# Patient Record
Sex: Female | Born: 1950 | Race: Black or African American | Hispanic: No | Smoking: Never smoker
Health system: Southern US, Community
[De-identification: ages and names within clinical notes are randomized; demographics above are authoritative.]

## PROBLEM LIST (undated history)

## (undated) DIAGNOSIS — E119 Type 2 diabetes mellitus without complications: Secondary | ICD-10-CM

## (undated) DIAGNOSIS — I10 Essential (primary) hypertension: Secondary | ICD-10-CM

## (undated) HISTORY — DX: Essential (primary) hypertension: I10

## (undated) HISTORY — PX: COLONOSCOPY: SHX174

## (undated) HISTORY — DX: Type 2 diabetes mellitus without complications: E11.9

---

## 1999-02-24 ENCOUNTER — Other Ambulatory Visit: Admission: RE | Admit: 1999-02-24 | Discharge: 1999-02-24 | Payer: Self-pay | Admitting: Gynecology

## 2000-01-12 ENCOUNTER — Encounter: Payer: Self-pay | Admitting: Gynecology

## 2000-01-12 ENCOUNTER — Encounter: Admission: RE | Admit: 2000-01-12 | Discharge: 2000-01-12 | Payer: Self-pay | Admitting: Gynecology

## 2000-03-15 ENCOUNTER — Other Ambulatory Visit: Admission: RE | Admit: 2000-03-15 | Discharge: 2000-03-15 | Payer: Self-pay | Admitting: Gynecology

## 2001-01-12 ENCOUNTER — Encounter: Admission: RE | Admit: 2001-01-12 | Discharge: 2001-01-12 | Payer: Self-pay | Admitting: Gynecology

## 2001-01-12 ENCOUNTER — Encounter: Payer: Self-pay | Admitting: Gynecology

## 2001-03-20 ENCOUNTER — Other Ambulatory Visit: Admission: RE | Admit: 2001-03-20 | Discharge: 2001-03-20 | Payer: Self-pay | Admitting: Gynecology

## 2002-01-16 ENCOUNTER — Encounter: Payer: Self-pay | Admitting: Gynecology

## 2002-01-16 ENCOUNTER — Encounter: Admission: RE | Admit: 2002-01-16 | Discharge: 2002-01-16 | Payer: Self-pay | Admitting: Gynecology

## 2002-04-16 ENCOUNTER — Other Ambulatory Visit: Admission: RE | Admit: 2002-04-16 | Discharge: 2002-04-16 | Payer: Self-pay | Admitting: Gynecology

## 2002-05-28 ENCOUNTER — Encounter: Admission: RE | Admit: 2002-05-28 | Discharge: 2002-08-26 | Payer: Self-pay | Admitting: Family Medicine

## 2003-01-17 ENCOUNTER — Encounter: Payer: Self-pay | Admitting: Gynecology

## 2003-01-17 ENCOUNTER — Encounter: Admission: RE | Admit: 2003-01-17 | Discharge: 2003-01-17 | Payer: Self-pay | Admitting: Gynecology

## 2003-04-18 ENCOUNTER — Other Ambulatory Visit: Admission: RE | Admit: 2003-04-18 | Discharge: 2003-04-18 | Payer: Self-pay | Admitting: Gynecology

## 2004-01-24 ENCOUNTER — Encounter: Admission: RE | Admit: 2004-01-24 | Discharge: 2004-01-24 | Payer: Self-pay | Admitting: Gynecology

## 2004-04-23 ENCOUNTER — Other Ambulatory Visit: Admission: RE | Admit: 2004-04-23 | Discharge: 2004-04-23 | Payer: Self-pay | Admitting: Gynecology

## 2005-01-26 ENCOUNTER — Encounter: Admission: RE | Admit: 2005-01-26 | Discharge: 2005-01-26 | Payer: Self-pay | Admitting: Gynecology

## 2005-04-26 ENCOUNTER — Other Ambulatory Visit: Admission: RE | Admit: 2005-04-26 | Discharge: 2005-04-26 | Payer: Self-pay | Admitting: Gynecology

## 2006-01-27 ENCOUNTER — Encounter: Admission: RE | Admit: 2006-01-27 | Discharge: 2006-01-27 | Payer: Self-pay | Admitting: Gynecology

## 2007-01-30 ENCOUNTER — Encounter: Admission: RE | Admit: 2007-01-30 | Discharge: 2007-01-30 | Payer: Self-pay | Admitting: Gynecology

## 2008-02-09 ENCOUNTER — Encounter: Admission: RE | Admit: 2008-02-09 | Discharge: 2008-02-09 | Payer: Self-pay | Admitting: Gynecology

## 2009-02-10 ENCOUNTER — Encounter: Admission: RE | Admit: 2009-02-10 | Discharge: 2009-02-10 | Payer: Self-pay | Admitting: Gynecology

## 2010-02-11 ENCOUNTER — Encounter: Admission: RE | Admit: 2010-02-11 | Discharge: 2010-02-11 | Payer: Self-pay | Admitting: Gynecology

## 2011-01-05 ENCOUNTER — Other Ambulatory Visit: Payer: Self-pay | Admitting: Gynecology

## 2011-01-05 DIAGNOSIS — Z1239 Encounter for other screening for malignant neoplasm of breast: Secondary | ICD-10-CM

## 2011-02-16 ENCOUNTER — Ambulatory Visit
Admission: RE | Admit: 2011-02-16 | Discharge: 2011-02-16 | Disposition: A | Discharge status: Home / Self Care | Payer: BC Managed Care – PPO | Source: Ambulatory Visit | Attending: Gynecology | Admitting: Gynecology

## 2011-02-16 DIAGNOSIS — Z1239 Encounter for other screening for malignant neoplasm of breast: Secondary | ICD-10-CM

## 2011-02-18 ENCOUNTER — Other Ambulatory Visit: Payer: Self-pay | Admitting: Gynecology

## 2011-02-18 DIAGNOSIS — R928 Other abnormal and inconclusive findings on diagnostic imaging of breast: Secondary | ICD-10-CM

## 2011-02-25 ENCOUNTER — Ambulatory Visit
Admission: RE | Admit: 2011-02-25 | Discharge: 2011-02-25 | Disposition: A | Discharge status: Home / Self Care | Payer: BC Managed Care – PPO | Source: Ambulatory Visit | Attending: Gynecology | Admitting: Gynecology

## 2011-02-25 DIAGNOSIS — R928 Other abnormal and inconclusive findings on diagnostic imaging of breast: Secondary | ICD-10-CM

## 2011-07-20 ENCOUNTER — Other Ambulatory Visit: Payer: Self-pay | Admitting: Gynecology

## 2011-07-20 DIAGNOSIS — R921 Mammographic calcification found on diagnostic imaging of breast: Secondary | ICD-10-CM

## 2011-08-26 ENCOUNTER — Ambulatory Visit
Admission: RE | Admit: 2011-08-26 | Discharge: 2011-08-26 | Disposition: A | Discharge status: Home / Self Care | Payer: BC Managed Care – PPO | Source: Ambulatory Visit | Attending: Gynecology | Admitting: Gynecology

## 2011-08-26 DIAGNOSIS — R921 Mammographic calcification found on diagnostic imaging of breast: Secondary | ICD-10-CM

## 2012-01-18 ENCOUNTER — Other Ambulatory Visit: Payer: Self-pay | Admitting: Gynecology

## 2012-01-18 DIAGNOSIS — R921 Mammographic calcification found on diagnostic imaging of breast: Secondary | ICD-10-CM

## 2012-02-18 ENCOUNTER — Inpatient Hospital Stay: Admission: RE | Admit: 2012-02-18 | Payer: BC Managed Care – PPO | Source: Ambulatory Visit

## 2012-03-01 ENCOUNTER — Ambulatory Visit
Admission: RE | Admit: 2012-03-01 | Discharge: 2012-03-01 | Disposition: A | Discharge status: Home / Self Care | Payer: BC Managed Care – PPO | Source: Ambulatory Visit | Attending: Gynecology | Admitting: Gynecology

## 2012-03-01 DIAGNOSIS — R921 Mammographic calcification found on diagnostic imaging of breast: Secondary | ICD-10-CM

## 2012-06-07 ENCOUNTER — Encounter: Payer: BC Managed Care – PPO | Attending: Unknown Physician Specialty | Admitting: Dietician

## 2012-06-07 DIAGNOSIS — Z713 Dietary counseling and surveillance: Secondary | ICD-10-CM | POA: Insufficient documentation

## 2012-06-07 DIAGNOSIS — E119 Type 2 diabetes mellitus without complications: Secondary | ICD-10-CM | POA: Insufficient documentation

## 2012-06-08 ENCOUNTER — Encounter: Payer: Self-pay | Admitting: Dietician

## 2012-06-08 NOTE — Progress Notes (Signed)
  Patient was seen on 06/07/2012 for the first of a series of three diabetes self-management courses at the [facility]. The following learning objectives were met by the patient during this course:   Defines the role of glucose and insulin  Identifies type of diabetes and pathophysiology  Defines the diagnostic criteria for diabetes and prediabetes  States the risk factors for Type 2 Diabetes  States the symptoms of Type 2 Diabetes  Defines Type 2 Diabetes treatment goals  Defines Type 2 Diabetes treatment options  States the rationale for glucose monitoring  Identifies A1C, glucose targets, and testing times  Identifies proper sharps disposal  Defines the purpose of a diabetes food plan  Identifies carbohydrate food groups  Defines effects of carbohydrate foods on glucose levels  Identifies carbohydrate choices/grams/food labels  States benefits of physical activity and effect on glucose  Review of suggested activity guidelines  HgA1C: 8.3%  (05/25/2012)  Handouts given during class include:  Type 2 Diabetes: Basics Book  My Food Plan Book  Food and Activity Log  Patient has established the following initial goals:  Increase exercise  Follow a diabetes meal plan  Lose weight  Follow-Up Plan: Attend the Diabetes Self-Management Core Classes

## 2012-07-06 ENCOUNTER — Ambulatory Visit: Payer: BC Managed Care – PPO

## 2012-07-20 ENCOUNTER — Ambulatory Visit: Payer: BC Managed Care – PPO

## 2012-08-03 ENCOUNTER — Encounter: Payer: BC Managed Care – PPO | Attending: Unknown Physician Specialty | Admitting: Dietician

## 2012-08-03 DIAGNOSIS — E119 Type 2 diabetes mellitus without complications: Secondary | ICD-10-CM | POA: Insufficient documentation

## 2012-08-03 DIAGNOSIS — Z713 Dietary counseling and surveillance: Secondary | ICD-10-CM | POA: Insufficient documentation

## 2012-08-04 NOTE — Progress Notes (Signed)
  Patient was seen on 08/03/2012 for the second of a series of three diabetes self-management courses at the [facility]. The following learning objectives were met by the patient during this course:   Explain basic nutrition maintenance and quality assurance  Describe causes, symptoms and treatment of hypoglycemia and hyperglycemia  Explain how to manage diabetes during illness  Describe the importance of good nutrition for health and healthy eating strategies  List strategies to follow meal plan when dining out  Describe the effects of alcohol on glucose and how to use it safely  Describe problem solving skills for day-to-day glucose challenges  Describe strategies to use when treatment plan needs to change  Identify important factors involved in successful weight loss  Describe ways to remain physically active  Describe the impact of regular activity on insulin resistance   Handouts given in class:  Refrigerator magnet for Sick Day Guidelines  [facility] Oral medication/insulin handout  Follow-Up Plan: Patient will attend the final class of the ADA Diabetes Self-Care Education.   

## 2012-08-17 ENCOUNTER — Encounter: Payer: Self-pay | Admitting: *Deleted

## 2012-08-17 ENCOUNTER — Encounter: Payer: BC Managed Care – PPO | Attending: Unknown Physician Specialty | Admitting: *Deleted

## 2012-08-17 DIAGNOSIS — Z713 Dietary counseling and surveillance: Secondary | ICD-10-CM | POA: Insufficient documentation

## 2012-08-17 DIAGNOSIS — E119 Type 2 diabetes mellitus without complications: Secondary | ICD-10-CM | POA: Insufficient documentation

## 2012-08-17 NOTE — Patient Instructions (Signed)
Goals:  Follow Diabetes Meal Plan as instructed  Eat 3 meals and 2 snacks, every 3-5 hrs  Limit carbohydrate intake to 30-45 grams carbohydrate/meal  Limit carbohydrate intake to 15 grams carbohydrate/snack  Add lean protein foods to meals/snacks  Monitor glucose levels as instructed by your doctor  Aim for 30 mins of physical activity daily  Bring food record and glucose log to your next nutrition visit 

## 2012-08-17 NOTE — Progress Notes (Signed)
  Patient was seen on 08/17/12 for the third of a series of three diabetes self-management courses at the [facility]. The following learning objectives were met by the patient during this course:    Describe how diabetes changes over time   Identify diabetes complications and ways to prevent them   Describe strategies that can promote heart health including lowering blood pressure and cholesterol   Describe strategies to lower dietary fat and sodium in the diet   Identify physical activities that benefit cardiovascular health   Evaluate success in meeting personal goal   Describe the belief that they can live successfully with diabetes day to day   Establish 2-3 goals that they will plan to diligently work on until they return for the free 6-month follow-up visit  The following handouts were given in class:  3 Month Follow Up Visit handout  Goal setting handout  Class evaluation form  Your patient has established the following 3 month goals for diabetes self-care:  Increase activity at least 3 days a week  Be active 15 minutes  Follow-Up Plan: Patient will attend a 3 month follow-up visit for diabetes self-management education.

## 2012-11-16 ENCOUNTER — Encounter: Payer: Self-pay | Admitting: *Deleted

## 2012-11-16 ENCOUNTER — Ambulatory Visit: Payer: BC Managed Care – PPO | Admitting: *Deleted

## 2012-11-16 ENCOUNTER — Encounter: Payer: BC Managed Care – PPO | Attending: Unknown Physician Specialty | Admitting: *Deleted

## 2012-11-16 DIAGNOSIS — Z713 Dietary counseling and surveillance: Secondary | ICD-10-CM | POA: Insufficient documentation

## 2012-11-16 DIAGNOSIS — E119 Type 2 diabetes mellitus without complications: Secondary | ICD-10-CM | POA: Insufficient documentation

## 2012-11-16 NOTE — Patient Instructions (Signed)
Goals:  Follow Diabetes Meal Plan as instructed  Eat 3 meals and 2 snacks, every 3-5 hrs  Limit carbohydrate intake to 45 grams carbohydrate/meal  Limit carbohydrate intake to 15 grams carbohydrate/snack  Add lean protein foods to meals/snacks  Monitor glucose levels as instructed by your doctor  Aim for 30 mins of physical activity daily  Bring food record and glucose log to your next nutrition visit  

## 2012-11-16 NOTE — Progress Notes (Signed)
HbA1c  Is close to 6%!  Patient was seen on 11/16/12 for their 3 month follow-up as a part of the diabetes self-management courses at the [facility]. The following learning objectives were met by your patient during this course:  Patient self reports the following:  Diabetes control has improved since diabetes self-management training: feels more in control Number of days blood glucose is >200: none Last MD appointment for diabetes: August Changes in treatment plan: none Confidence with ability to manage diabetes: feels good Areas for improvement with diabetes self-care: eating more healthy Willingness to participate in diabetes support group: not able to make it do to scheduling  Please see Diabetes Flow sheet for findings related to patient's self-care.  Follow-Up Plan: Patient is eligible for a "free" 30 minute diabetes self-care appointment in the next year. Patient to call and schedule as needed.

## 2012-12-13 HISTORY — PX: BREAST BIOPSY: SHX20

## 2013-01-23 ENCOUNTER — Other Ambulatory Visit: Payer: Self-pay | Admitting: Gynecology

## 2013-01-23 DIAGNOSIS — R921 Mammographic calcification found on diagnostic imaging of breast: Secondary | ICD-10-CM

## 2013-03-02 ENCOUNTER — Ambulatory Visit
Admission: RE | Admit: 2013-03-02 | Discharge: 2013-03-02 | Disposition: A | Discharge status: Home / Self Care | Payer: BC Managed Care – PPO | Source: Ambulatory Visit | Attending: Gynecology | Admitting: Gynecology

## 2013-03-02 ENCOUNTER — Other Ambulatory Visit: Payer: Self-pay | Admitting: Gynecology

## 2013-03-02 DIAGNOSIS — R921 Mammographic calcification found on diagnostic imaging of breast: Secondary | ICD-10-CM

## 2013-03-09 ENCOUNTER — Ambulatory Visit
Admission: RE | Admit: 2013-03-09 | Discharge: 2013-03-09 | Disposition: A | Discharge status: Home / Self Care | Payer: BC Managed Care – PPO | Source: Ambulatory Visit | Attending: Gynecology | Admitting: Gynecology

## 2013-03-09 DIAGNOSIS — R921 Mammographic calcification found on diagnostic imaging of breast: Secondary | ICD-10-CM

## 2013-06-28 ENCOUNTER — Other Ambulatory Visit: Payer: Self-pay | Admitting: Gynecology

## 2014-01-24 ENCOUNTER — Other Ambulatory Visit: Payer: Self-pay

## 2014-01-24 DIAGNOSIS — Z1231 Encounter for screening mammogram for malignant neoplasm of breast: Secondary | ICD-10-CM

## 2014-03-08 ENCOUNTER — Ambulatory Visit
Admission: RE | Admit: 2014-03-08 | Discharge: 2014-03-08 | Disposition: A | Discharge status: Home / Self Care | Payer: BC Managed Care – PPO | Source: Ambulatory Visit

## 2014-03-08 DIAGNOSIS — Z1231 Encounter for screening mammogram for malignant neoplasm of breast: Secondary | ICD-10-CM

## 2014-04-03 ENCOUNTER — Encounter: Payer: Self-pay | Admitting: Internal Medicine

## 2014-04-22 ENCOUNTER — Encounter: Payer: Self-pay | Admitting: Internal Medicine

## 2014-05-29 ENCOUNTER — Ambulatory Visit (AMBULATORY_SURGERY_CENTER): Payer: Self-pay | Admitting: *Deleted

## 2014-05-29 VITALS — Ht 64.5 in | Wt 177.0 lb

## 2014-05-29 DIAGNOSIS — Z1211 Encounter for screening for malignant neoplasm of colon: Secondary | ICD-10-CM

## 2014-05-29 MED ORDER — MOVIPREP 100 G PO SOLR: ORAL | Status: DC

## 2014-05-29 NOTE — Progress Notes (Signed)
Patient denies any allergies to eggs or soy. Patient denies any problems with anesthesia/sedation. Patient denies any oxygen use at home and does not take any diet/weight loss medications. EMMI education assisgned to patient on colonoscopy, this was explained and instructions given to patient. 

## 2014-05-30 ENCOUNTER — Encounter: Payer: Self-pay | Admitting: Internal Medicine

## 2014-06-12 ENCOUNTER — Encounter: Payer: BC Managed Care – PPO | Admitting: Internal Medicine

## 2014-06-21 ENCOUNTER — Encounter: Payer: Self-pay | Admitting: Internal Medicine

## 2014-06-21 ENCOUNTER — Ambulatory Visit (AMBULATORY_SURGERY_CENTER): Payer: BC Managed Care – PPO | Admitting: Internal Medicine

## 2014-06-21 VITALS — BP 127/73 | HR 55 | Temp 97.8°F | Resp 29 | Ht 64.5 in | Wt 177.0 lb

## 2014-06-21 DIAGNOSIS — Z1211 Encounter for screening for malignant neoplasm of colon: Secondary | ICD-10-CM

## 2014-06-21 MED ORDER — SODIUM CHLORIDE 0.9 % IV SOLN: 500.0000 mL | INTRAVENOUS | Status: DC

## 2014-06-21 NOTE — Op Note (Addendum)
[facility]  [address] [place], [id number]   COLONOSCOPY PROCEDURE REPORT  PATIENT: Ellen Rosales, Ellen Rosales  MR#: [id number] BIRTHDATE: 1951/03/18 , 57  yrs. old GENDER: Female ENDOSCOPIST: Lafayette Dragon, MD REFERRED [id number] Leola Brazil, M.D. , Dr Marcelle Overlie PROCEDURE DATE:  06/21/2014 PROCEDURE:   Colonoscopy, screening First Screening Colonoscopy - Avg.  risk and is 50 yrs.  old or older - No.  Prior Negative Screening - Now for repeat screening. 10 or more years since last screening  History of Adenoma - Now for follow-up colonoscopy & has been > or = to 3 yrs.  N/A  Polyps Removed Today? No.  Recommend repeat exam, <10 yrs? No. ASA CLASS:   Class II INDICATIONS:Average risk patient for colon cancer and loss colonoscopy July 2005 was normal. MEDICATIONS: MAC sedation, administered by CRNA and propofol (Diprivan) 200mg  IV  DESCRIPTION OF PROCEDURE:   After the risks benefits and alternatives of the procedure were thoroughly explained, informed consent was obtained.  A digital rectal exam revealed no abnormalities of the rectum.   The LB [id number]  endoscope was introduced through the anus and advanced to the cecum, which was identified by both the appendix and ileocecal valve. No adverse events experienced.   The quality of the prep was excellent, using MoviPrep  The instrument was then slowly withdrawn as the colon was fully examined.      COLON FINDINGS: A normal appearing cecum, ileocecal valve, and appendiceal orifice were identified.  The ascending, hepatic flexure, transverse, splenic flexure, descending, sigmoid colon and rectum appeared unremarkable.  No polyps or cancers were seen. Retroflexed views revealed no abnormalities. The time to cecum=5 minutes 13 seconds.  Withdrawal time=6 minutes 570 seconds.  The scope was withdrawn and the procedure completed. COMPLICATIONS: There were no complications.  ENDOSCOPIC IMPRESSION: Normal  colon  RECOMMENDATIONS: high fiber diet Recall colonoscopy in 10 years   eSigned:  Lafayette Dragon, MD 06/21/2014 11:01 AM Revised: 06/21/2014 11:01 AM  cc:   PATIENT NAME:  Sheba, Whaling MR#: [id number]

## 2014-06-21 NOTE — Patient Instructions (Signed)
YOU HAD AN ENDOSCOPIC PROCEDURE TODAY AT THE [facility]: Refer to the procedure report that was given to you for any specific questions about what was found during the examination.  If the procedure report does not answer your questions, please call your gastroenterologist to clarify.  If you requested that your care partner not be given the details of your procedure findings, then the procedure report has been included in a sealed envelope for you to review at your convenience later.  YOU SHOULD EXPECT: Some feelings of bloating in the abdomen. Passage of more gas than usual.  Walking can help get rid of the air that was put into your GI tract during the procedure and reduce the bloating. If you had a lower endoscopy (such as a colonoscopy or flexible sigmoidoscopy) you may notice spotting of blood in your stool or on the toilet paper. If you underwent a bowel prep for your procedure, then you may not have a normal bowel movement for a few days.  DIET: Your first meal following the procedure should be a light meal and then it is ok to progress to your normal diet.  A half-sandwich or bowl of soup is an example of a good first meal.  Heavy or fried foods are harder to digest and may make you feel nauseous or bloated.  Likewise meals heavy in dairy and vegetables can cause extra gas to form and this can also increase the bloating.  Drink plenty of fluids but you should avoid alcoholic beverages for 24 hours.  ACTIVITY: Your care partner should take you home directly after the procedure.  You should plan to take it easy, moving slowly for the rest of the day.  You can resume normal activity the day after the procedure however you should NOT DRIVE or use heavy machinery for 24 hours (because of the sedation medicines used during the test).    SYMPTOMS TO REPORT IMMEDIATELY: A gastroenterologist can be reached at any hour.  During normal business hours, 8:30 AM to 5:00 PM Monday through Friday,  call [phone].  After hours and on weekends, please call the GI answering service at [phone] who will take a message and have the physician on call contact you.   Following lower endoscopy (colonoscopy or flexible sigmoidoscopy):  Excessive amounts of blood in the stool  Significant tenderness or worsening of abdominal pains  Swelling of the abdomen that is new, acute  Fever of 100F or higher    FOLLOW UP: If any biopsies were taken you will be contacted by phone or by letter within the next 1-3 weeks.  Call your gastroenterologist if you have not heard about the biopsies in 3 weeks.  Our staff will call the home number listed on your records the next business day following your procedure to check on you and address any questions or concerns that you may have at that time regarding the information given to you following your procedure. This is a courtesy call and so if there is no answer at the home number and we have not heard from you through the emergency physician on call, we will assume that you have returned to your regular daily activities without incident.  SIGNATURES/CONFIDENTIALITY: You and/or your care partner have signed paperwork which will be entered into your electronic medical record.  These signatures attest to the fact that that the information above on your After Visit Summary has been reviewed and is understood.  Full responsibility of the confidentiality   of this discharge information lies with you and/or your care-partner.  NORMAL COLONOSCOPY, repeat in 10 years-2025  High fiber diet information given.

## 2014-06-21 NOTE — Progress Notes (Signed)
A/ox3 pleased with MAC, report to Jane RN 

## 2014-06-24 ENCOUNTER — Telehealth: Payer: Self-pay | Admitting: *Deleted

## 2014-06-24 NOTE — Telephone Encounter (Signed)
  Follow up Call-  Call back number 06/21/2014  Post procedure Call Back phone  # [phone]  Permission to leave phone message Yes     Patient questions:  Do you have a fever, pain , or abdominal swelling? No. Pain Score  0 *  Have you tolerated food without any problems? Yes.    Have you been able to return to your normal activities? Yes.    Do you have any questions about your discharge instructions: Diet   No. Medications  No. Follow up visit  No.  Do you have questions or concerns about your Care? No.  Actions: * If pain score is 4 or above: No action needed, pain <4.

## 2014-07-11 ENCOUNTER — Other Ambulatory Visit: Payer: Self-pay | Admitting: Gynecology

## 2014-07-12 LAB — CYTOLOGY - PAP

## 2014-08-23 ENCOUNTER — Encounter: Payer: Self-pay | Admitting: Internal Medicine

## 2014-12-12 ENCOUNTER — Other Ambulatory Visit: Payer: Self-pay

## 2014-12-12 DIAGNOSIS — Z1231 Encounter for screening mammogram for malignant neoplasm of breast: Secondary | ICD-10-CM

## 2015-03-10 ENCOUNTER — Ambulatory Visit
Admission: RE | Admit: 2015-03-10 | Discharge: 2015-03-10 | Disposition: A | Discharge status: Home / Self Care | Payer: BLUE CROSS/BLUE SHIELD | Source: Ambulatory Visit

## 2015-03-10 DIAGNOSIS — Z1231 Encounter for screening mammogram for malignant neoplasm of breast: Secondary | ICD-10-CM

## 2015-07-23 ENCOUNTER — Other Ambulatory Visit: Payer: Self-pay

## 2015-07-28 LAB — CYTOLOGY - PAP

## 2016-01-30 ENCOUNTER — Other Ambulatory Visit: Payer: Self-pay

## 2016-01-30 DIAGNOSIS — Z1231 Encounter for screening mammogram for malignant neoplasm of breast: Secondary | ICD-10-CM

## 2016-03-12 ENCOUNTER — Ambulatory Visit
Admission: RE | Admit: 2016-03-12 | Discharge: 2016-03-12 | Disposition: A | Discharge status: Home / Self Care | Payer: Managed Care, Other (non HMO) | Source: Ambulatory Visit

## 2016-03-12 DIAGNOSIS — Z1231 Encounter for screening mammogram for malignant neoplasm of breast: Secondary | ICD-10-CM

## 2016-08-06 ENCOUNTER — Other Ambulatory Visit: Payer: Self-pay | Admitting: Obstetrics and Gynecology

## 2016-08-09 LAB — CYTOLOGY - PAP

## 2017-01-31 ENCOUNTER — Other Ambulatory Visit: Payer: Self-pay | Admitting: Family Medicine

## 2017-01-31 DIAGNOSIS — Z1231 Encounter for screening mammogram for malignant neoplasm of breast: Secondary | ICD-10-CM

## 2017-03-18 ENCOUNTER — Ambulatory Visit
Admission: RE | Admit: 2017-03-18 | Discharge: 2017-03-18 | Disposition: A | Discharge status: Home / Self Care | Payer: Managed Care, Other (non HMO) | Source: Ambulatory Visit | Attending: Family Medicine | Admitting: Family Medicine

## 2017-03-18 DIAGNOSIS — Z1231 Encounter for screening mammogram for malignant neoplasm of breast: Secondary | ICD-10-CM

## 2018-02-08 ENCOUNTER — Other Ambulatory Visit: Payer: Self-pay | Admitting: Family Medicine

## 2018-02-08 DIAGNOSIS — Z1231 Encounter for screening mammogram for malignant neoplasm of breast: Secondary | ICD-10-CM

## 2018-03-21 ENCOUNTER — Ambulatory Visit
Admission: RE | Admit: 2018-03-21 | Discharge: 2018-03-21 | Disposition: A | Discharge status: Home / Self Care | Payer: Managed Care, Other (non HMO) | Source: Ambulatory Visit | Attending: Family Medicine | Admitting: Family Medicine

## 2018-03-21 DIAGNOSIS — Z1231 Encounter for screening mammogram for malignant neoplasm of breast: Secondary | ICD-10-CM

## 2018-08-23 ENCOUNTER — Other Ambulatory Visit (HOSPITAL_COMMUNITY): Payer: Self-pay | Admitting: Family Medicine

## 2018-08-23 DIAGNOSIS — R011 Cardiac murmur, unspecified: Secondary | ICD-10-CM

## 2018-09-15 ENCOUNTER — Other Ambulatory Visit: Payer: Self-pay

## 2018-09-15 ENCOUNTER — Ambulatory Visit (HOSPITAL_COMMUNITY): Payer: Managed Care, Other (non HMO) | Attending: Cardiology

## 2018-09-15 DIAGNOSIS — R011 Cardiac murmur, unspecified: Secondary | ICD-10-CM | POA: Diagnosis not present

## 2018-09-15 DIAGNOSIS — I728 Aneurysm of other specified arteries: Secondary | ICD-10-CM | POA: Diagnosis not present

## 2018-09-15 DIAGNOSIS — E119 Type 2 diabetes mellitus without complications: Secondary | ICD-10-CM | POA: Diagnosis not present

## 2018-09-15 DIAGNOSIS — I083 Combined rheumatic disorders of mitral, aortic and tricuspid valves: Secondary | ICD-10-CM | POA: Diagnosis not present

## 2019-02-09 ENCOUNTER — Other Ambulatory Visit: Payer: Self-pay | Admitting: Family Medicine

## 2019-02-09 DIAGNOSIS — Z1231 Encounter for screening mammogram for malignant neoplasm of breast: Secondary | ICD-10-CM

## 2019-03-23 ENCOUNTER — Ambulatory Visit: Payer: Managed Care, Other (non HMO)

## 2019-05-08 ENCOUNTER — Ambulatory Visit
Admission: RE | Admit: 2019-05-08 | Discharge: 2019-05-08 | Disposition: A | Discharge status: Home / Self Care | Payer: Managed Care, Other (non HMO) | Source: Ambulatory Visit | Attending: Family Medicine | Admitting: Family Medicine

## 2019-05-08 ENCOUNTER — Other Ambulatory Visit: Payer: Self-pay

## 2019-05-08 DIAGNOSIS — Z1231 Encounter for screening mammogram for malignant neoplasm of breast: Secondary | ICD-10-CM

## 2019-05-09 ENCOUNTER — Other Ambulatory Visit: Payer: Self-pay | Admitting: Family Medicine

## 2019-05-09 DIAGNOSIS — R928 Other abnormal and inconclusive findings on diagnostic imaging of breast: Secondary | ICD-10-CM

## 2019-05-17 ENCOUNTER — Ambulatory Visit
Admission: RE | Admit: 2019-05-17 | Discharge: 2019-05-17 | Disposition: A | Discharge status: Home / Self Care | Payer: Managed Care, Other (non HMO) | Source: Ambulatory Visit | Attending: Family Medicine | Admitting: Family Medicine

## 2019-05-17 ENCOUNTER — Other Ambulatory Visit: Payer: Self-pay

## 2019-05-17 ENCOUNTER — Ambulatory Visit: Payer: Managed Care, Other (non HMO)

## 2019-05-17 DIAGNOSIS — R928 Other abnormal and inconclusive findings on diagnostic imaging of breast: Secondary | ICD-10-CM

## 2019-05-17 IMAGING — MG DIGITAL DIAGNOSTIC UNILATERAL LEFT MAMMOGRAM WITH TOMO AND CAD
8 series · 8 of 24 positions shown · non-contrast
Comparison: Previous exam(s).

CLINICAL DATA: 67-year-old female recalled from screening mammogram
dated [DATE] for a possible left breast asymmetry.

EXAM:
DIGITAL DIAGNOSTIC UNILATERAL LEFT MAMMOGRAM WITH CAD AND TOMO

[L ML synth-2D]
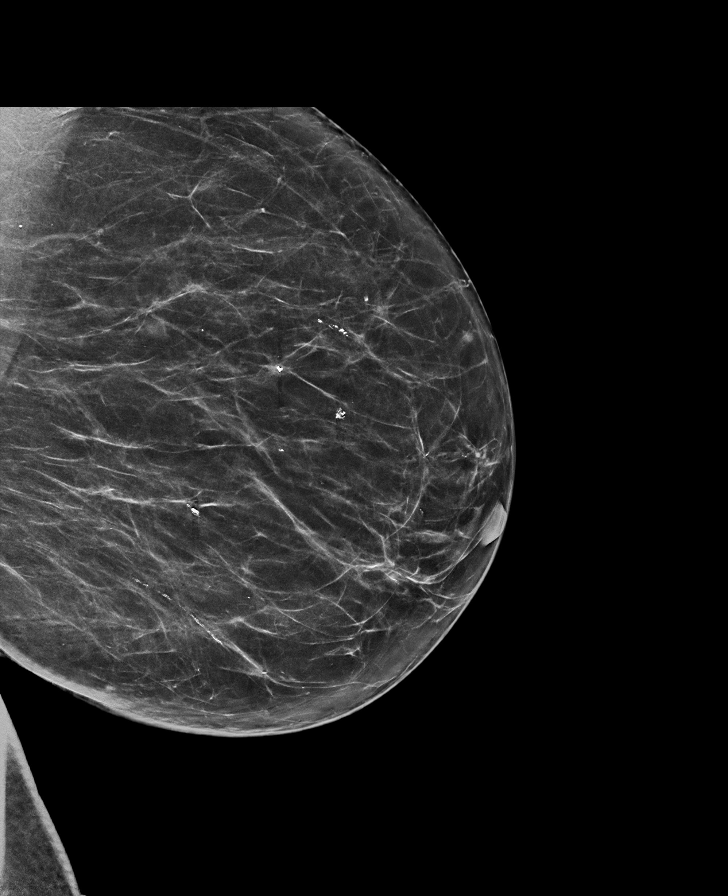

[L MLO synth-2D]
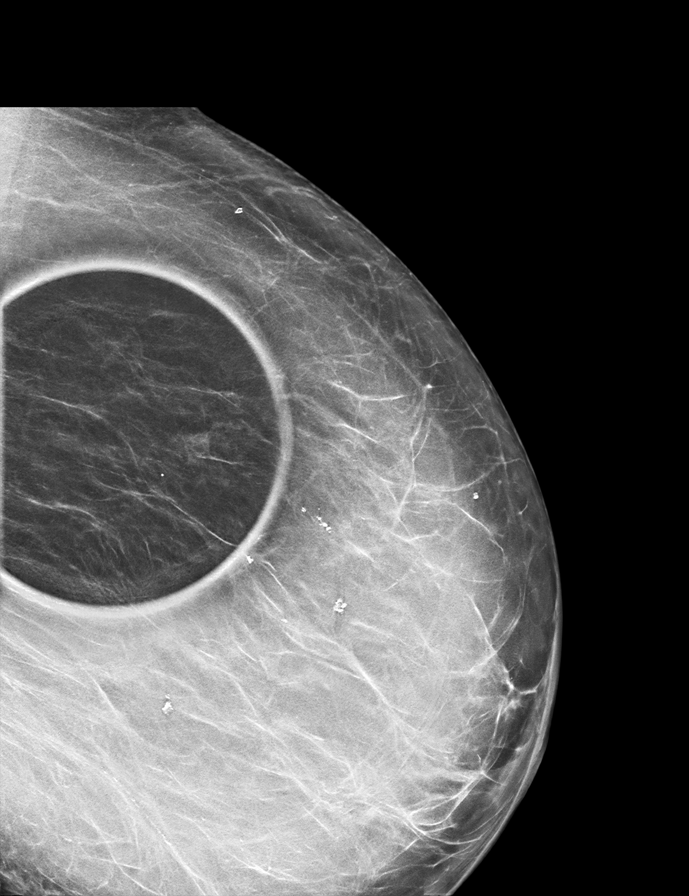

[L CC synth-2D (1 of 2)]
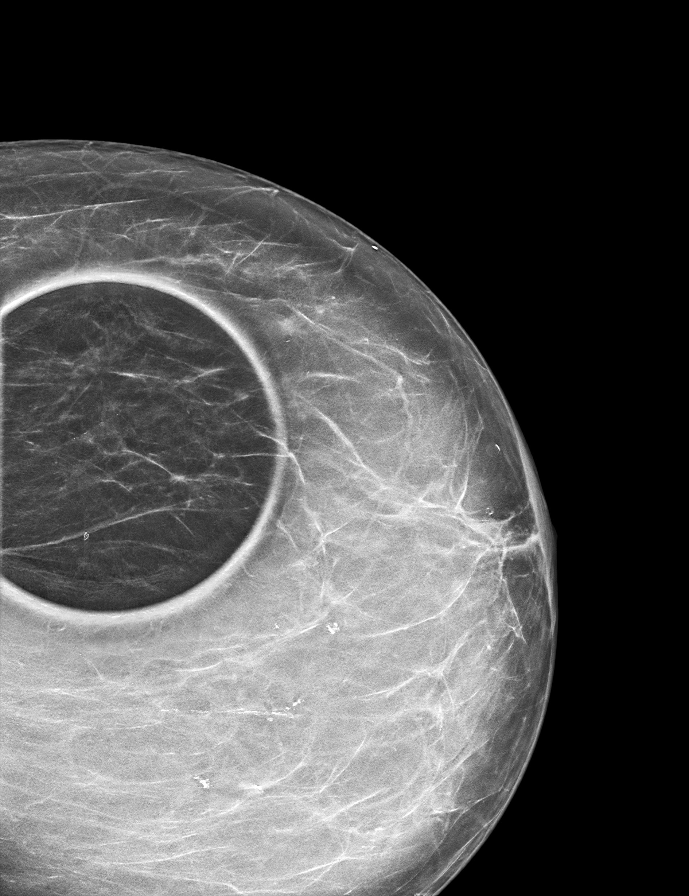

[L CC synth-2D (2 of 2)]
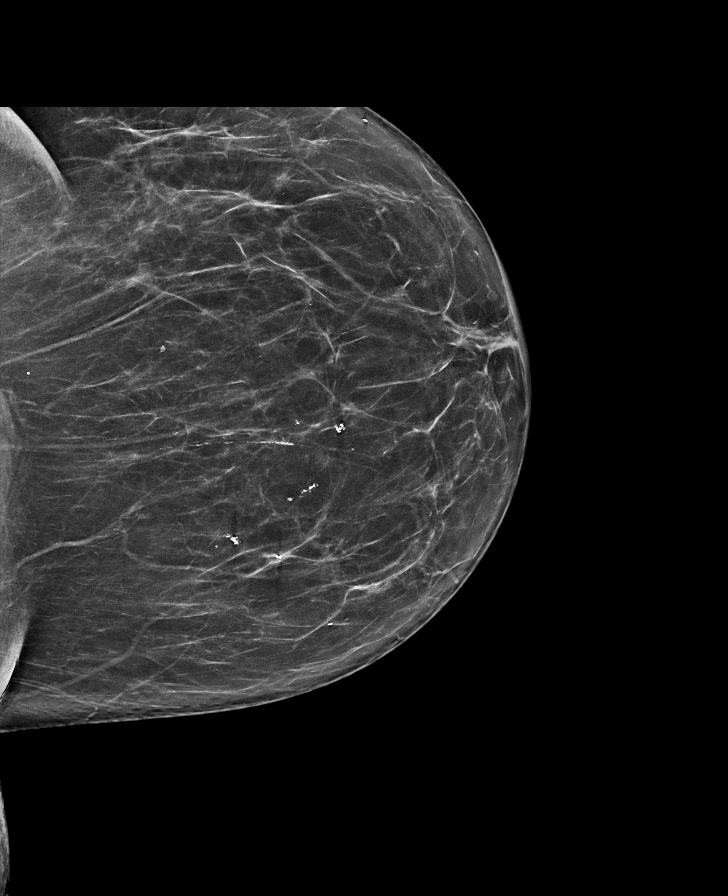

[L MLO tomo · tomo slice 35/68.0]
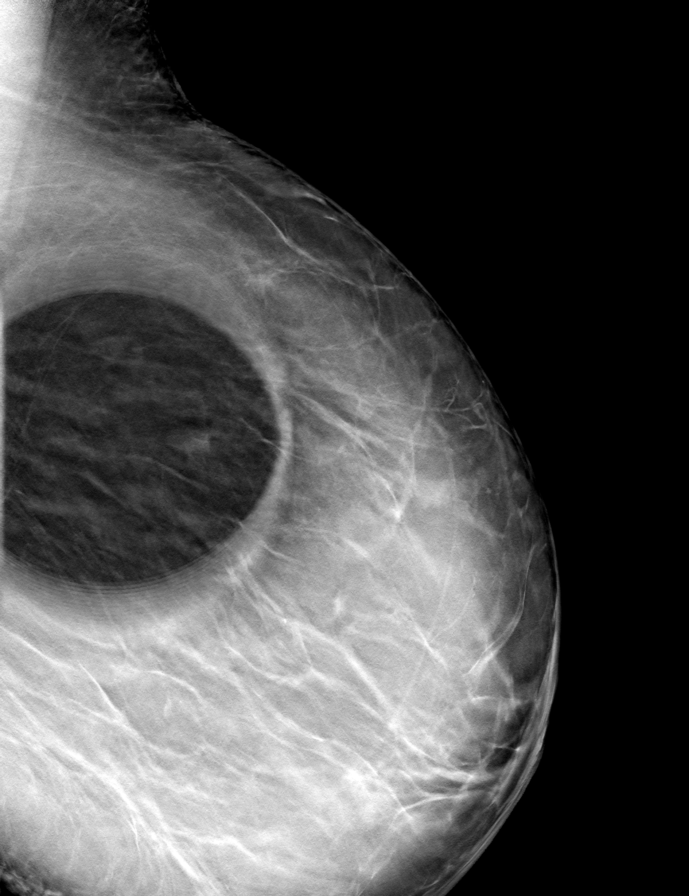

[L CC tomo (1 of 2) · tomo slice 39/76.0]
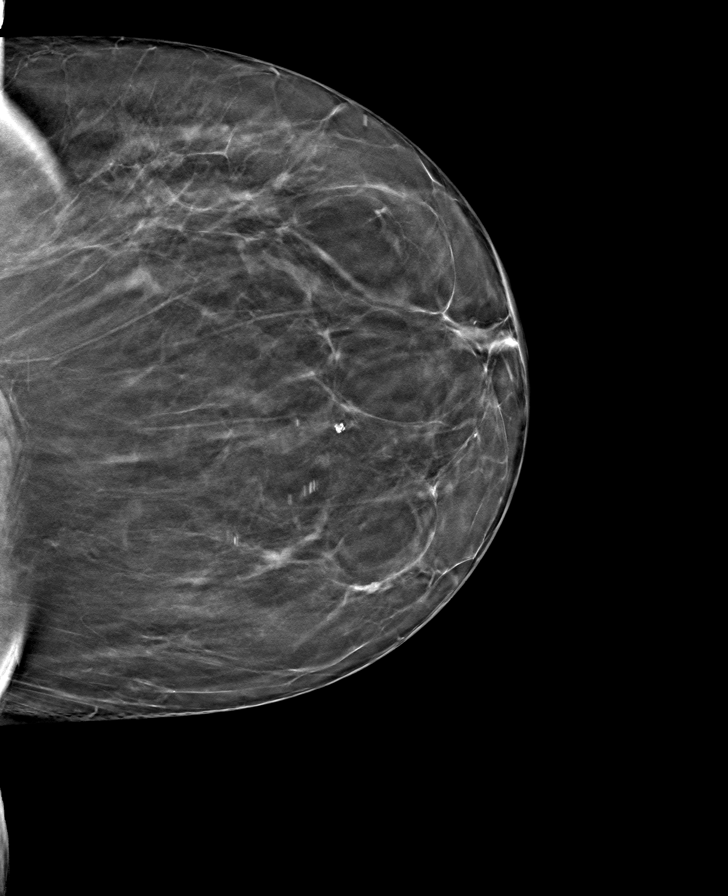

[L ML tomo · tomo slice 39/78.0]
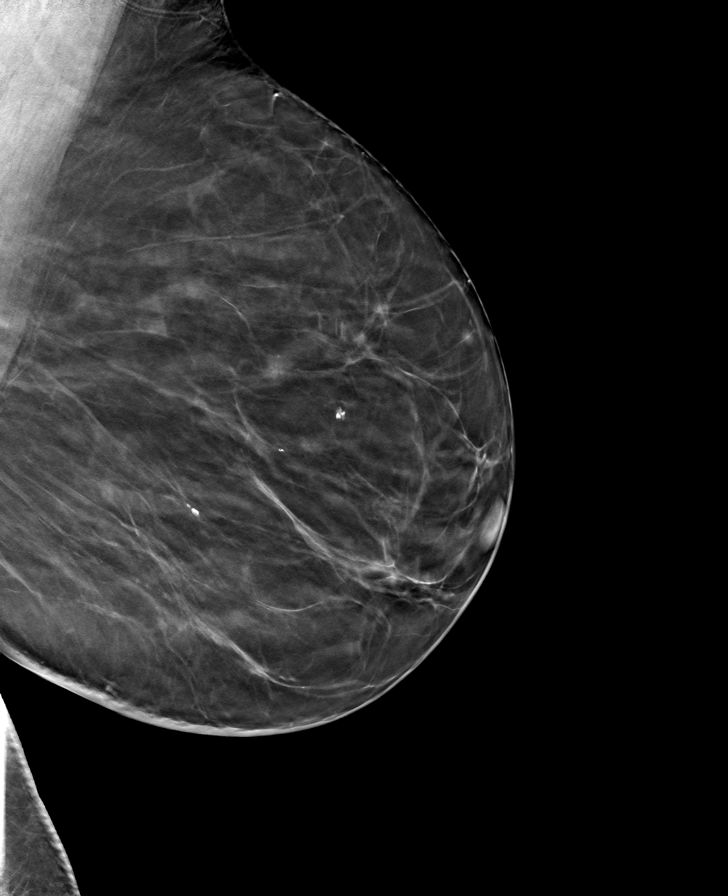

[L CC tomo (2 of 2) · tomo slice 30/59.0]
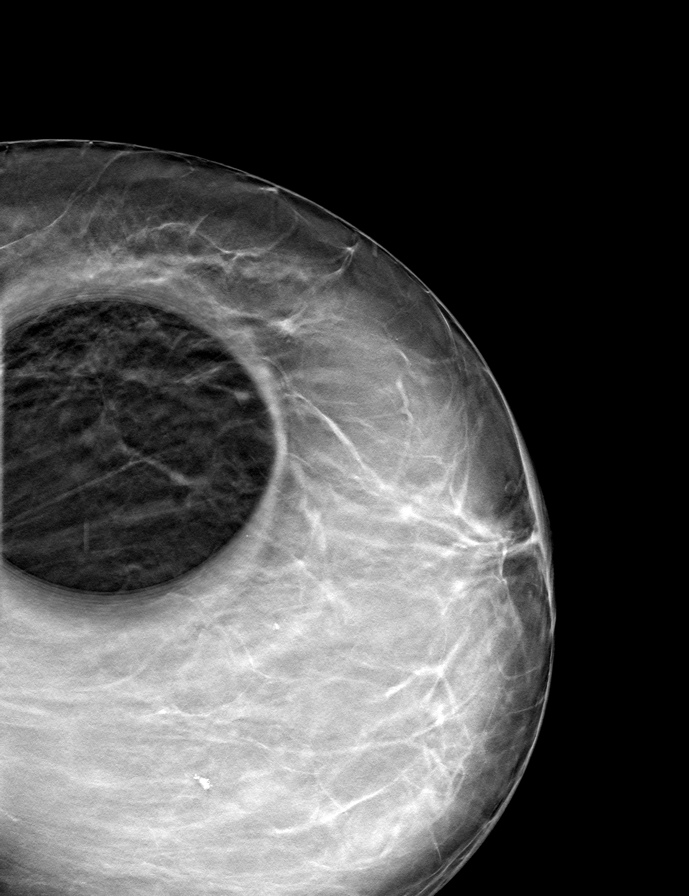

[8 of 24 positions shown; findings below may reference images not displayed]

ACR Breast Density Category b: There are scattered areas of
fibroglandular density.
FINDINGS: A focal asymmetry in the upper outer left breast at posterior depth
partially resolves into fibroglandular tissue on today's additional
views. The remaining parenchymal pattern is stable compared to
multiple prior mammograms dating back to at least [KA]. No
additional suspicious findings are identified.

Mammographic images were processed with CAD.
IMPRESSION: No mammographic evidence of malignancy.

RECOMMENDATION:
Screening mammogram in one year.(Code:[KA])

I have discussed the findings and recommendations with the patient.
Results were also provided in writing at the conclusion of the
visit. If applicable, a reminder letter will be sent to the patient
regarding the next appointment.

BI-RADS CATEGORY  1: Negative.

## 2020-02-21 ENCOUNTER — Ambulatory Visit: Payer: Medicare Other | Attending: Internal Medicine

## 2020-02-21 DIAGNOSIS — Z23 Encounter for immunization: Secondary | ICD-10-CM

## 2020-02-21 NOTE — Progress Notes (Signed)
   Covid-19 Vaccination Clinic  Name:  Ellen Rosales    MRN: [id number] DOB: 02-Dec-1951  02/21/2020  [name] was observed post Covid-19 immunization for 15 minutes without incident. She was provided with Vaccine Information Sheet and instruction to access the V-Safe system.   [name] was instructed to call 911 with any severe reactions post vaccine: Marland Kitchen Difficulty breathing  . Swelling of face and throat  . A fast heartbeat  . A bad rash all over body  . Dizziness and weakness   Immunizations Administered    Name Date Dose VIS Date Route   Pfizer COVID-19 Vaccine 02/21/2020  8:24 AM 0.3 mL 11/23/2019 Intramuscular   Manufacturer: [place]   Lot: [id number]   [place]: [id number]

## 2020-02-21 NOTE — Progress Notes (Signed)
   Covid-19 Vaccination Clinic  Name:  Ellen Rosales    MRN: [id number] DOB: 07/16/1951  02/21/2020  [name] was observed post Covid-19 immunization for 15 minutes without incident. She was provided with Vaccine Information Sheet and instruction to access the V-Safe system.   [name] was instructed to call 911 with any severe reactions post vaccine: Marland Kitchen Difficulty breathing  . Swelling of face and throat  . A fast heartbeat  . A bad rash all over body  . Dizziness and weakness   Immunizations Administered    Name Date Dose VIS Date Route   Pfizer COVID-19 Vaccine 02/21/2020  8:24 AM 0.3 mL 11/23/2019 Intramuscular   Manufacturer: [place]   Lot: [id number]   [place]: [id number]

## 2020-03-17 ENCOUNTER — Ambulatory Visit: Payer: Medicare Other | Attending: Internal Medicine

## 2020-03-17 DIAGNOSIS — Z23 Encounter for immunization: Secondary | ICD-10-CM

## 2020-03-17 NOTE — Progress Notes (Signed)
   Covid-19 Vaccination Clinic  Name:  LAYANNA WELLENDORF    MRN: [id number] DOB: 07-29-1951  03/17/2020  Ms. Pryer was observed post Covid-19 immunization for 15 minutes without incident. She was provided with Vaccine Information Sheet and instruction to access the V-Safe system.   Ms. Vong was instructed to call 911 with any severe reactions post vaccine: Marland Kitchen Difficulty breathing  . Swelling of face and throat  . A fast heartbeat  . A bad rash all over body  . Dizziness and weakness   Immunizations Administered    Name Date Dose VIS Date Route   Pfizer COVID-19 Vaccine 03/17/2020  8:27 AM 0.3 mL 11/23/2019 Intramuscular   Manufacturer: [place]   Lot: [id number]   [place]: [id number]

## 2020-04-15 ENCOUNTER — Other Ambulatory Visit: Payer: Self-pay | Admitting: Family Medicine

## 2020-04-15 DIAGNOSIS — Z1231 Encounter for screening mammogram for malignant neoplasm of breast: Secondary | ICD-10-CM

## 2020-05-08 ENCOUNTER — Ambulatory Visit
Admission: RE | Admit: 2020-05-08 | Discharge: 2020-05-08 | Disposition: A | Discharge status: Home / Self Care | Payer: Medicare Other | Source: Ambulatory Visit | Attending: Family Medicine | Admitting: Family Medicine

## 2020-05-08 ENCOUNTER — Other Ambulatory Visit: Payer: Self-pay

## 2020-05-08 DIAGNOSIS — Z1231 Encounter for screening mammogram for malignant neoplasm of breast: Secondary | ICD-10-CM

## 2020-09-27 ENCOUNTER — Ambulatory Visit: Payer: Medicare Other | Attending: Internal Medicine

## 2020-09-27 DIAGNOSIS — Z23 Encounter for immunization: Secondary | ICD-10-CM

## 2020-09-27 NOTE — Progress Notes (Signed)
   Covid-19 Vaccination Clinic  Name:  Ellen Rosales    MRN: [id number] DOB: [birth year]  09/27/2020  Ms. Ellen Rosales was observed post Covid-19 immunization for 15 minutes without incident. She was provided with Vaccine Information Sheet and instruction to access the V-Safe system.   Ms. Ellen Rosales was instructed to call 911 with any severe reactions post vaccine: Marland Kitchen Difficulty breathing  . Swelling of face and throat  . A fast heartbeat  . A bad rash all over body  . Dizziness and weakness

## 2021-03-04 DIAGNOSIS — Z7984 Long term (current) use of oral hypoglycemic drugs: Secondary | ICD-10-CM | POA: Diagnosis not present

## 2021-03-04 DIAGNOSIS — I10 Essential (primary) hypertension: Secondary | ICD-10-CM | POA: Diagnosis not present

## 2021-03-04 DIAGNOSIS — E1169 Type 2 diabetes mellitus with other specified complication: Secondary | ICD-10-CM | POA: Diagnosis not present

## 2021-03-04 DIAGNOSIS — E785 Hyperlipidemia, unspecified: Secondary | ICD-10-CM | POA: Diagnosis not present

## 2021-03-09 DIAGNOSIS — Z1389 Encounter for screening for other disorder: Secondary | ICD-10-CM | POA: Diagnosis not present

## 2021-03-09 DIAGNOSIS — E785 Hyperlipidemia, unspecified: Secondary | ICD-10-CM | POA: Diagnosis not present

## 2021-03-09 DIAGNOSIS — E673 Hypervitaminosis D: Secondary | ICD-10-CM | POA: Diagnosis not present

## 2021-03-09 DIAGNOSIS — Z0000 Encounter for general adult medical examination without abnormal findings: Secondary | ICD-10-CM | POA: Diagnosis not present

## 2021-03-09 DIAGNOSIS — I10 Essential (primary) hypertension: Secondary | ICD-10-CM | POA: Diagnosis not present

## 2021-03-09 DIAGNOSIS — E1169 Type 2 diabetes mellitus with other specified complication: Secondary | ICD-10-CM | POA: Diagnosis not present

## 2021-03-09 DIAGNOSIS — Z7984 Long term (current) use of oral hypoglycemic drugs: Secondary | ICD-10-CM | POA: Diagnosis not present

## 2021-03-10 ENCOUNTER — Other Ambulatory Visit: Payer: Self-pay | Admitting: Family Medicine

## 2021-03-10 ENCOUNTER — Other Ambulatory Visit: Payer: Self-pay | Admitting: Student

## 2021-03-10 DIAGNOSIS — Z1231 Encounter for screening mammogram for malignant neoplasm of breast: Secondary | ICD-10-CM

## 2021-04-15 DIAGNOSIS — E119 Type 2 diabetes mellitus without complications: Secondary | ICD-10-CM | POA: Diagnosis not present

## 2021-05-13 ENCOUNTER — Ambulatory Visit
Admission: RE | Admit: 2021-05-13 | Discharge: 2021-05-13 | Disposition: A | Discharge status: Home / Self Care | Payer: Medicare Other | Source: Ambulatory Visit | Attending: Family Medicine | Admitting: Family Medicine

## 2021-05-13 ENCOUNTER — Other Ambulatory Visit: Payer: Self-pay

## 2021-05-13 DIAGNOSIS — Z1231 Encounter for screening mammogram for malignant neoplasm of breast: Secondary | ICD-10-CM

## 2022-03-05 DIAGNOSIS — E673 Hypervitaminosis D: Secondary | ICD-10-CM | POA: Diagnosis not present

## 2022-03-05 DIAGNOSIS — E785 Hyperlipidemia, unspecified: Secondary | ICD-10-CM | POA: Diagnosis not present

## 2022-03-05 DIAGNOSIS — E1169 Type 2 diabetes mellitus with other specified complication: Secondary | ICD-10-CM | POA: Diagnosis not present

## 2022-03-12 DIAGNOSIS — I10 Essential (primary) hypertension: Secondary | ICD-10-CM | POA: Diagnosis not present

## 2022-03-12 DIAGNOSIS — Z79899 Other long term (current) drug therapy: Secondary | ICD-10-CM | POA: Diagnosis not present

## 2022-03-12 DIAGNOSIS — Z0000 Encounter for general adult medical examination without abnormal findings: Secondary | ICD-10-CM | POA: Diagnosis not present

## 2022-03-12 DIAGNOSIS — Z23 Encounter for immunization: Secondary | ICD-10-CM | POA: Diagnosis not present

## 2022-03-12 DIAGNOSIS — Z1389 Encounter for screening for other disorder: Secondary | ICD-10-CM | POA: Diagnosis not present

## 2022-03-12 DIAGNOSIS — E785 Hyperlipidemia, unspecified: Secondary | ICD-10-CM | POA: Diagnosis not present

## 2022-03-12 DIAGNOSIS — E1169 Type 2 diabetes mellitus with other specified complication: Secondary | ICD-10-CM | POA: Diagnosis not present

## 2022-04-01 ENCOUNTER — Ambulatory Visit: Payer: Medicare Other | Attending: Internal Medicine

## 2022-04-01 ENCOUNTER — Other Ambulatory Visit (HOSPITAL_BASED_OUTPATIENT_CLINIC_OR_DEPARTMENT_OTHER): Payer: Self-pay

## 2022-04-01 DIAGNOSIS — Z23 Encounter for immunization: Secondary | ICD-10-CM

## 2022-04-01 MED ORDER — PFIZER COVID-19 VAC BIVALENT 30 MCG/0.3ML IM SUSP
INTRAMUSCULAR | 0 refills | Status: DC
  Filled 2022-04-01: qty 0.3, 1d supply, fill #0

## 2022-04-01 NOTE — Progress Notes (Signed)
? ?  Covid-19 Vaccination Clinic ? ?Name:  Ellen Rosales    ?MRN: [id number] ?DOB: January 27, 1951 ? ?04/01/2022 ? ?[name] was observed post Covid-19 immunization for 15 minutes without incident. She was provided with Vaccine Information Sheet and instruction to access the V-Safe system.  ? ?[name] was instructed to call 911 with any severe reactions post vaccine: ?Difficulty breathing  ?Swelling of face and throat  ?A fast heartbeat  ?A bad rash all over body  ?Dizziness and weakness  ? ?Immunizations Administered   ? ? Name Date Dose VIS Date Route  ? Ambulance person Booster 04/01/2022  1:49 PM 0.3 mL 08/12/2021 Intramuscular  ? Manufacturer: [place]: [id number]  ? [place]: [phone]  ? ?  ? ? ?

## 2022-04-06 ENCOUNTER — Other Ambulatory Visit: Payer: Self-pay | Admitting: Family Medicine

## 2022-04-06 DIAGNOSIS — Z1231 Encounter for screening mammogram for malignant neoplasm of breast: Secondary | ICD-10-CM

## 2022-05-14 ENCOUNTER — Ambulatory Visit
Admission: RE | Admit: 2022-05-14 | Discharge: 2022-05-14 | Disposition: A | Discharge status: Home / Self Care | Payer: Medicare Other | Source: Ambulatory Visit | Attending: Family Medicine | Admitting: Family Medicine

## 2022-05-14 DIAGNOSIS — Z1231 Encounter for screening mammogram for malignant neoplasm of breast: Secondary | ICD-10-CM | POA: Diagnosis not present

## 2022-06-14 DIAGNOSIS — E119 Type 2 diabetes mellitus without complications: Secondary | ICD-10-CM | POA: Diagnosis not present

## 2022-08-06 DIAGNOSIS — E1169 Type 2 diabetes mellitus with other specified complication: Secondary | ICD-10-CM | POA: Diagnosis not present

## 2022-08-06 DIAGNOSIS — Z79899 Other long term (current) drug therapy: Secondary | ICD-10-CM | POA: Diagnosis not present

## 2022-08-24 DIAGNOSIS — E538 Deficiency of other specified B group vitamins: Secondary | ICD-10-CM | POA: Diagnosis not present

## 2022-08-24 DIAGNOSIS — Z23 Encounter for immunization: Secondary | ICD-10-CM | POA: Diagnosis not present

## 2022-08-24 DIAGNOSIS — E785 Hyperlipidemia, unspecified: Secondary | ICD-10-CM | POA: Diagnosis not present

## 2022-08-24 DIAGNOSIS — E1169 Type 2 diabetes mellitus with other specified complication: Secondary | ICD-10-CM | POA: Diagnosis not present

## 2022-08-24 DIAGNOSIS — I10 Essential (primary) hypertension: Secondary | ICD-10-CM | POA: Diagnosis not present

## 2022-09-21 DIAGNOSIS — E538 Deficiency of other specified B group vitamins: Secondary | ICD-10-CM | POA: Diagnosis not present

## 2022-09-21 DIAGNOSIS — I10 Essential (primary) hypertension: Secondary | ICD-10-CM | POA: Diagnosis not present

## 2022-09-21 DIAGNOSIS — D509 Iron deficiency anemia, unspecified: Secondary | ICD-10-CM | POA: Diagnosis not present

## 2022-10-06 ENCOUNTER — Encounter: Payer: Self-pay | Admitting: Internal Medicine

## 2022-10-06 DIAGNOSIS — K921 Melena: Secondary | ICD-10-CM | POA: Diagnosis not present

## 2022-11-18 ENCOUNTER — Encounter: Payer: Self-pay | Admitting: Internal Medicine

## 2022-11-18 ENCOUNTER — Ambulatory Visit: Payer: Medicare Other | Admitting: Internal Medicine

## 2022-11-18 VITALS — BP 140/80 | HR 68 | Ht 64.5 in | Wt 162.0 lb

## 2022-11-18 DIAGNOSIS — D509 Iron deficiency anemia, unspecified: Secondary | ICD-10-CM

## 2022-11-18 MED ORDER — NA SULFATE-K SULFATE-MG SULF 17.5-3.13-1.6 GM/177ML PO SOLN: 1.0000 | ORAL | 0 refills | Status: DC

## 2022-11-18 NOTE — Progress Notes (Addendum)
Chief Complaint: IDA  HPI : 71 year old female with history of DM, obesity, and HTN presents with IDA  Denies melena or hematochezia. Denies N&V, ab pain, diarrhea, constipation, or weight loss. Denies dysphagia, chest burning, or regurgitation. She is on iron supplements and has been on those since 09/2022. Denies family history of GI issues. Denies use of blood thinners. She will take ibuprofen every maybe once a month. Denies menstrual periods. Her last colonoscopy was in 2015 that was normal. Denies prior EGD.   Past Medical History:  Diagnosis Date   Diabetes mellitus without complication ([place])    Hypertension    Past Surgical History:  Procedure Laterality Date   BREAST BIOPSY Left 2014   benign   CESAREAN SECTION     x2   COLONOSCOPY     Family History  Problem Relation Age of Onset   Breast cancer Sister 31   Colon cancer Neg Hx    Social History   Tobacco Use   Smoking status: Never   Smokeless tobacco: Never  Substance Use Topics   Alcohol use: No   Drug use: No   Current Outpatient Medications  Medication Sig Dispense Refill   amLODipine (NORVASC) 5 MG tablet Take 5 mg by mouth daily.     Calcium Carbonate-Vitamin D (CALCIUM + D PO) Take 600 mg by mouth 2 (two) times daily.     cyanocobalamin (VITAMIN B12) 1000 MCG tablet Take 1,000 mcg by mouth daily.     Ferrous Sulfate (IRON) 325 (65 Fe) MG TABS Take 1 tablet by mouth daily at 2 PM.     lisinopril-hydrochlorothiazide (PRINZIDE,ZESTORETIC) 20-12.5 MG per tablet Take 1 tablet by mouth daily.     metFORMIN (GLUCOPHAGE) 500 MG tablet Take 500 mg by mouth 2 (two) times daily after a meal.     No current facility-administered medications for this visit.   No Known Allergies   Review of Systems: All systems reviewed and negative except where noted in HPI.   Physical Exam: BP (!) 140/80 (BP Location: Left Arm, Patient Position: Sitting, Cuff Size: Normal)   Pulse 68   Ht 5' 4.5" (1.638 m)   Wt 162 lb  (73.5 kg)   SpO2 98%   BMI 27.38 kg/m  Constitutional: Pleasant,well-developed, female in no acute distress. HEENT: Normocephalic and atraumatic. Conjunctivae are normal. No scleral icterus. Cardiovascular: Normal rate, regular rhythm.  Pulmonary/chest: Effort normal and breath sounds normal. No wheezing, rales or rhonchi. Abdominal: Soft, nondistended, nontender. Bowel sounds active throughout. There are no masses palpable. No hepatomegaly. Extremities: No edema Neurological: Alert and oriented to person place and time. Skin: Skin is warm and dry. No rashes noted. Psychiatric: Normal mood and affect. Behavior is normal.  Labs 09/2022: BMP with mildly elevated BUN of 27. CBC with low Hb of 10.9. Ferritin 27.1. Iroin sat low at 14%. Folate nml. Vit B12 low at 165.   Colonoscopy 06/22/04: Normal colon.  Colonoscopy 06/21/14: Normal colon. Prep quality was excellent.  ASSESSMENT AND PLAN: IDA Vitamin B12 deficiency Patient presents with new onset IDA that was recently diagnosed by her PCP. Her last colonoscopy was in 2015 that was normal. Will plan for EGD and colonoscopy to look for GI sources of her IDA. - EGD/colonoscopy [facility] - Will get recent labs from PCP Dr. Juliet Rude, MD  I spent 45 minutes of time, including in depth chart review, independent review of results as outlined above, communicating results with the patient directly, face-to-face time  with the patient, coordinating care, ordering studies and medications as appropriate, and documentation.   ADDENDUM: Received labs from Dr. Baldomero Lamy office, which confirm IDA and vitamin B12 deficiency. Added labs above.

## 2022-11-18 NOTE — Patient Instructions (Signed)
If you are age 71 or older, your body mass index should be between 23-30. Your Body mass index is 27.38 kg/m. If this is out of the aforementioned range listed, please consider follow up with your Primary Care Provider. ________________________________________________________  The [facility] providers would like to encourage you to use [facility] to communicate with providers for non-urgent requests or questions.  Due to long hold times on the telephone, sending your provider a message by [facility] may be a faster and more efficient way to get a response.  Please allow 48 business hours for a response.  Please remember that this is for non-urgent requests.  _______________________________________________________  Ellen Rosales have been scheduled for an endoscopy and colonoscopy. Please follow the written instructions given to you at your visit today. Please pick up your prep supplies at the pharmacy within the next 1-3 days. If you use inhalers (even only as needed), please bring them with you on the day of your procedure.  Due to recent changes in healthcare laws, you may see the results of your imaging and laboratory studies on MyChart before your provider has had a chance to review them.  We understand that in some cases there may be results that are confusing or concerning to you. Not all laboratory results come back in the same time frame and the provider may be waiting for multiple results in order to interpret others.  Please give [place] 48 hours in order for your provider to thoroughly review all the results before contacting the office for clarification of your results.   We will attempt to obtain recent lab work from Dr [employer] office.  Thank you for entrusting me with your care and choosing [facility].  Dr Lorenso Courier

## 2022-11-26 ENCOUNTER — Other Ambulatory Visit (HOSPITAL_BASED_OUTPATIENT_CLINIC_OR_DEPARTMENT_OTHER): Payer: Self-pay

## 2022-11-26 MED ORDER — COMIRNATY 30 MCG/0.3ML IM SUSY
PREFILLED_SYRINGE | INTRAMUSCULAR | 0 refills | Status: DC
  Filled 2022-11-26: qty 0.3, 1d supply, fill #0

## 2023-01-12 ENCOUNTER — Encounter: Payer: Self-pay | Admitting: Internal Medicine

## 2023-01-13 ENCOUNTER — Encounter: Payer: Self-pay | Admitting: Certified Registered Nurse Anesthetist

## 2023-01-19 ENCOUNTER — Encounter: Payer: Self-pay | Admitting: Internal Medicine

## 2023-01-19 ENCOUNTER — Ambulatory Visit (AMBULATORY_SURGERY_CENTER): Payer: Medicare Other | Admitting: Internal Medicine

## 2023-01-19 VITALS — BP 122/66 | HR 63 | Temp 98.4°F | Resp 12 | Ht 64.0 in | Wt 162.0 lb

## 2023-01-19 DIAGNOSIS — D123 Benign neoplasm of transverse colon: Secondary | ICD-10-CM | POA: Diagnosis not present

## 2023-01-19 DIAGNOSIS — K2950 Unspecified chronic gastritis without bleeding: Secondary | ICD-10-CM | POA: Diagnosis not present

## 2023-01-19 DIAGNOSIS — K6389 Other specified diseases of intestine: Secondary | ICD-10-CM | POA: Diagnosis not present

## 2023-01-19 DIAGNOSIS — K2971 Gastritis, unspecified, with bleeding: Secondary | ICD-10-CM

## 2023-01-19 DIAGNOSIS — D509 Iron deficiency anemia, unspecified: Secondary | ICD-10-CM

## 2023-01-19 DIAGNOSIS — K635 Polyp of colon: Secondary | ICD-10-CM | POA: Diagnosis not present

## 2023-01-19 MED ORDER — OMEPRAZOLE 40 MG PO CPDR: 40.0000 mg | DELAYED_RELEASE_CAPSULE | Freq: Two times a day (BID) | ORAL | 0 refills | Status: AC

## 2023-01-19 MED ORDER — SODIUM CHLORIDE 0.9 % IV SOLN: 500.0000 mL | Freq: Once | INTRAVENOUS | Status: DC

## 2023-01-19 NOTE — Op Note (Signed)
[place] Patient Name: Ellen Rosales Procedure Date: 01/19/2023 10:06 AM MRN: [id number] Endoscopist: Adline Mango Gibson , , [id number] Age: 72 Referring MD:  Date of Birth: 1951/09/18 Gender: Female Account #: [id number] Procedure:                Upper GI endoscopy Indications:              Iron deficiency anemia Medicines:                Monitored Anesthesia Care Procedure:                Pre-Anesthesia Assessment:                           - Prior to the procedure, a History and Physical                            was performed, and patient medications and                            allergies were reviewed. The patient's tolerance of                            previous anesthesia was also reviewed. The risks                            and benefits of the procedure and the sedation                            options and risks were discussed with the patient.                            All questions were answered, and informed consent                            was obtained. Prior Anticoagulants: The patient has                            taken no anticoagulant or antiplatelet agents. ASA                            Grade Assessment: II - A patient with mild systemic                            disease. After reviewing the risks and benefits,                            the patient was deemed in satisfactory condition to                            undergo the procedure.                           After obtaining informed consent, the endoscope was  passed under direct vision. Throughout the                            procedure, the patient's blood pressure, pulse, and                            oxygen saturations were monitored continuously. The                            GIF [id number] was introduced through the                            mouth, and advanced to the second part of duodenum.                            The upper GI endoscopy  was accomplished without                            difficulty. The patient tolerated the procedure                            well. Scope In: Scope Out: Findings:                 The examined esophagus was normal.                           Localized inflammation with hemorrhage                            characterized by congestion (edema), erosions and                            erythema was found in the gastric body and in the                            gastric antrum. Biopsies were taken with a cold                            forceps for histology.                           The examined duodenum was normal. Biopsies were                            taken with a cold forceps for histology. Complications:            No immediate complications. Estimated Blood Loss:     Estimated blood loss was minimal. Impression:               - Normal esophagus.                           - Gastritis with hemorrhage. Biopsied.                           -  Normal examined duodenum. Biopsied. Recommendation:           - Await pathology results.                           - Use Prilosec (omeprazole) 40 mg PO BID for 8                            weeks.                           - Perform a colonoscopy today. Dr Georgian Co "[place]" [place],  01/19/2023 10:46:59 AM

## 2023-01-19 NOTE — Patient Instructions (Addendum)
Take your new medicine (omeprazole) 1 pill twice daily for 8 weeks.  Take it on an empty stomach.  Your office appointment is 04/08/2023 at 10:30.  Be 15 minutes early, please.  Resume all of your medications today.   YOU HAD AN ENDOSCOPIC PROCEDURE TODAY AT [facility]:   Refer to the procedure report that was given to you for any specific questions about what was found during the examination.  If the procedure report does not answer your questions, please call your gastroenterologist to clarify.  If you requested that your care partner not be given the details of your procedure findings, then the procedure report has been included in a sealed envelope for you to review at your convenience later.  YOU SHOULD EXPECT: Some feelings of bloating in the abdomen. Passage of more gas than usual.  Walking can help get rid of the air that was put into your GI tract during the procedure and reduce the bloating. If you had a lower endoscopy (such as a colonoscopy or flexible sigmoidoscopy) you may notice spotting of blood in your stool or on the toilet paper. If you underwent a bowel prep for your procedure, you may not have a normal bowel movement for a few days.  Please Note:  You might notice some irritation and congestion in your nose or some drainage.  This is from the oxygen used during your procedure.  There is no need for concern and it should clear up in a day or so.  SYMPTOMS TO REPORT IMMEDIATELY:  Following lower endoscopy (colonoscopy or flexible sigmoidoscopy):  Excessive amounts of blood in the stool  Significant tenderness or worsening of abdominal pains  Swelling of the abdomen that is new, acute  Fever of 100F or higher  Following upper endoscopy (EGD)  Vomiting of blood or coffee ground material  New chest pain or pain under the shoulder blades  Painful or persistently difficult swallowing  New shortness of breath  Fever of 100F or higher  Black, tarry-looking  stools  For urgent or emergent issues, a gastroenterologist can be reached at any hour by calling [phone]. Do not use MyChart messaging for urgent concerns.    DIET:  We do recommend a small meal at first, but then you may proceed to your regular diet.  Drink plenty of fluids but you should avoid alcoholic beverages for 24 hours.  ACTIVITY:  You should plan to take it easy for the rest of today and you should NOT DRIVE or use heavy machinery until tomorrow (because of the sedation medicines used during the test).    FOLLOW UP: Our staff will call the number listed on your records the next business day following your procedure.  We will call around 7:15- 8:00 am to check on you and address any questions or concerns that you may have regarding the information given to you following your procedure. If we do not reach you, we will leave a message.     If any biopsies were taken you will be contacted by phone or by letter within the next 1-3 weeks.  Please call us at [phone] if you have not heard about the biopsies in 3 weeks.    SIGNATURES/CONFIDENTIALITY: You and/or your care partner have signed paperwork which will be entered into your electronic medical record.  These signatures attest to the fact that that the information above on your After Visit Summary has been reviewed and is understood.  Full responsibility of the  confidentiality of this discharge information lies with you and/or your care-partner. 

## 2023-01-19 NOTE — Progress Notes (Signed)
Report given to PACU, vss 

## 2023-01-19 NOTE — Progress Notes (Signed)
GASTROENTEROLOGY PROCEDURE H&P NOTE   Primary Care Physician: Cari Caraway, MD    Reason for Procedure:   IDA  Plan:    EGD/colonsocopy  Patient is appropriate for endoscopic procedure(s) in the ambulatory ([facility]) setting.  The nature of the procedure, as well as the risks, benefits, and alternatives were carefully and thoroughly reviewed with the patient. Ample time for discussion and questions allowed. The patient understood, was satisfied, and agreed to proceed.     HPI: Ellen Rosales is a 72 y.o. female who presents for EGD/colonoscopy for evaluation of IDA .  Patient was most recently seen in the Gastroenterology Clinic on 11/18/22.  No interval change in medical history since that appointment. Please refer to that note for full details regarding GI history and clinical presentation.   Past Medical History:  Diagnosis Date   Diabetes mellitus without complication ([place])    Hypertension     Past Surgical History:  Procedure Laterality Date   BREAST BIOPSY Left 2014   benign   CESAREAN SECTION     x2   COLONOSCOPY      Prior to Admission medications   Medication Sig Start Date End Date Taking? Authorizing Provider  amLODipine (NORVASC) 5 MG tablet Take 5 mg by mouth daily. 11/11/22   [provider]  Calcium Carbonate-Vitamin D (CALCIUM + D PO) Take 600 mg by mouth 2 (two) times daily.    [provider]  COVID-19 mRNA vaccine [id number] (COMIRNATY) syringe Inject into the muscle. 11/26/22     cyanocobalamin (VITAMIN B12) 1000 MCG tablet Take 1,000 mcg by mouth daily.    [provider]  Ferrous Sulfate (IRON) 325 (65 Fe) MG TABS Take 1 tablet by mouth daily at 2 PM.    [provider]  lisinopril-hydrochlorothiazide (PRINZIDE,ZESTORETIC) 20-12.5 MG per tablet Take 1 tablet by mouth daily.    [provider]  metFORMIN (GLUCOPHAGE) 500 MG tablet Take 500 mg by mouth 2 (two) times daily after a meal.    [provider]  Na Sulfate-K Sulfate-Mg Sulf (SUPREP BOWEL PREP KIT) 17.5-3.13-1.6 GM/177ML SOLN Take 1 kit by mouth as directed. 11/18/22   Sharyn Creamer, MD    Current Outpatient Medications  Medication Sig Dispense Refill   amLODipine (NORVASC) 5 MG tablet Take 5 mg by mouth daily.     Calcium Carbonate-Vitamin D (CALCIUM + D PO) Take 600 mg by mouth 2 (two) times daily.     COVID-19 mRNA vaccine 2023-2024 (COMIRNATY) syringe Inject into the muscle. 0.3 mL 0   cyanocobalamin (VITAMIN B12) 1000 MCG tablet Take 1,000 mcg by mouth daily.     Ferrous Sulfate (IRON) 325 (65 Fe) MG TABS Take 1 tablet by mouth daily at 2 PM.     lisinopril-hydrochlorothiazide (PRINZIDE,ZESTORETIC) 20-12.5 MG per tablet Take 1 tablet by mouth daily.     metFORMIN (GLUCOPHAGE) 500 MG tablet Take 500 mg by mouth 2 (two) times daily after a meal.     Na Sulfate-K Sulfate-Mg Sulf (SUPREP BOWEL PREP KIT) 17.5-3.13-1.6 GM/177ML SOLN Take 1 kit by mouth as directed. 324 mL 0   Current Facility-Administered Medications  Medication Dose Route Frequency Provider Last Rate Last Admin   0.9 %  sodium chloride infusion  500 mL Intravenous Once Sharyn Creamer, MD        Allergies as of 01/19/2023   (No Known Allergies)    Family History  Problem Relation Age of Onset   Breast cancer Sister 58  Colon cancer Neg Hx     Social History   Socioeconomic History   Marital status: Divorced    Spouse name: Not on file   Number of children: Not on file   Years of education: Not on file   Highest education level: Not on file  Occupational History   Not on file  Tobacco Use   Smoking status: Never   Smokeless tobacco: Never  Substance and Sexual Activity   Alcohol use: No   Drug use: No   Sexual activity: Not on file  Other Topics Concern   Not on file  Social History Narrative   Not on file   Social Determinants of Health   Financial Resource Strain: Not on file  Food Insecurity: Not on file   Transportation Needs: Not on file  Physical Activity: Not on file  Stress: Not on file  Social Connections: Not on file  Intimate Partner Violence: Not on file    Physical Exam: Vital signs in last 24 hours: BP (!) 140/76   Pulse 80   Temp 98.4 F (36.9 C)   Ht '5\' 4"'$  (1.626 m)   Wt 162 lb (73.5 kg)   SpO2 99%   BMI 27.81 kg/m  GEN: NAD EYE: Sclerae anicteric ENT: MMM CV: Non-tachycardic Pulm: No increased WOB GI: Soft NEURO:  Alert & Oriented   [name], MD [facility]   01/19/2023 9:47 AM

## 2023-01-19 NOTE — Op Note (Signed)
[place] Patient Name: Ellen Rosales Procedure Date: 01/19/2023 10:05 AM MRN: [id number] Endoscopist: Adline Mango [place] , , [id number] Age: 72 Referring MD:  Date of Birth: 11-Nov-1951 Gender: Female Account #: [id number] Procedure:                Colonoscopy Indications:              Iron deficiency anemia Medicines:                Monitored Anesthesia Care Procedure:                Pre-Anesthesia Assessment:                           - Prior to the procedure, a History and Physical                            was performed, and patient medications and                            allergies were reviewed. The patient's tolerance of                            previous anesthesia was also reviewed. The risks                            and benefits of the procedure and the sedation                            options and risks were discussed with the patient.                            All questions were answered, and informed consent                            was obtained. Prior Anticoagulants: The patient has                            taken no anticoagulant or antiplatelet agents. ASA                            Grade Assessment: II - A patient with mild systemic                            disease. After reviewing the risks and benefits,                            the patient was deemed in satisfactory condition to                            undergo the procedure.                           After obtaining informed consent, the colonoscope  was passed under direct vision. Throughout the                            procedure, the patient's blood pressure, pulse, and                            oxygen saturations were monitored continuously. The                            [id number] Colonoscope [id number] was introduced                            through the anus and advanced to the the terminal                            ileum. The colonoscopy was  performed without                            difficulty. The patient tolerated the procedure                            well. The quality of the bowel preparation was                            good. The terminal ileum, ileocecal valve,                            appendiceal orifice, and rectum were photographed. Scope In: 10:18:53 AM Scope Out: 10:39:38 AM Scope Withdrawal Time: 0 hours 14 minutes 38 seconds  Total Procedure Duration: 0 hours 20 minutes 45 seconds  Findings:                 The terminal ileum appeared normal.                           Three sessile polyps were found in the transverse                            colon. The polyps were 3 to 6 mm in size. These                            polyps were removed with a cold snare. Resection                            and retrieval were complete.                           Non-bleeding internal hemorrhoids were found during                            retroflexion. Complications:            No immediate complications. Estimated Blood Loss:     Estimated blood loss was minimal. Impression:               -  The examined portion of the ileum was normal.                           - Three 3 to 6 mm polyps in the transverse colon,                            removed with a cold snare. Resected and retrieved.                           - Non-bleeding internal hemorrhoids. Recommendation:           - Discharge patient to home (with escort).                           - Await pathology results.                           - Return to GI clinic in 6 weeks.                           - The findings and recommendations were discussed                            with the patient. Dr Georgian Co "[facility]" [place],  01/19/2023 10:50:19 AM

## 2023-01-19 NOTE — Progress Notes (Signed)
Called to room to assist during endoscopic procedure.  Patient ID and intended procedure confirmed with present staff. Received instructions for my participation in the procedure from the performing physician.  

## 2023-01-19 NOTE — Progress Notes (Signed)
1006 Robinul 0.1 mg IV given due large amount of secretions upon assessment.  MD made aware, vss

## 2023-01-20 ENCOUNTER — Telehealth: Payer: Self-pay

## 2023-01-20 NOTE — Telephone Encounter (Signed)
  Follow up Call-     01/19/2023    9:49 AM  Call back number  Post procedure Call Back phone  # [phone]  Permission to leave phone message Yes     Patient questions:  Do you have a fever, pain , or abdominal swelling? No. Pain Score  0 *  Have you tolerated food without any problems? Yes.    Have you been able to return to your normal activities? Yes.    Do you have any questions about your discharge instructions: Diet   No. Medications  No. Follow up visit  No.  Do you have questions or concerns about your Care? No.  Actions: * If pain score is 4 or above: No action needed, pain <4.

## 2023-01-21 ENCOUNTER — Encounter: Payer: Self-pay | Admitting: Internal Medicine

## 2023-02-21 DIAGNOSIS — E538 Deficiency of other specified B group vitamins: Secondary | ICD-10-CM | POA: Diagnosis not present

## 2023-02-21 DIAGNOSIS — E785 Hyperlipidemia, unspecified: Secondary | ICD-10-CM | POA: Diagnosis not present

## 2023-02-21 DIAGNOSIS — E1169 Type 2 diabetes mellitus with other specified complication: Secondary | ICD-10-CM | POA: Diagnosis not present

## 2023-03-03 DIAGNOSIS — N1831 Chronic kidney disease, stage 3a: Secondary | ICD-10-CM | POA: Diagnosis not present

## 2023-03-03 DIAGNOSIS — E785 Hyperlipidemia, unspecified: Secondary | ICD-10-CM | POA: Diagnosis not present

## 2023-03-03 DIAGNOSIS — E538 Deficiency of other specified B group vitamins: Secondary | ICD-10-CM | POA: Diagnosis not present

## 2023-03-03 DIAGNOSIS — E1122 Type 2 diabetes mellitus with diabetic chronic kidney disease: Secondary | ICD-10-CM | POA: Diagnosis not present

## 2023-03-03 DIAGNOSIS — I10 Essential (primary) hypertension: Secondary | ICD-10-CM | POA: Diagnosis not present

## 2023-03-03 DIAGNOSIS — K2951 Unspecified chronic gastritis with bleeding: Secondary | ICD-10-CM | POA: Diagnosis not present

## 2023-03-03 DIAGNOSIS — R011 Cardiac murmur, unspecified: Secondary | ICD-10-CM | POA: Diagnosis not present

## 2023-03-03 DIAGNOSIS — K2901 Acute gastritis with bleeding: Secondary | ICD-10-CM | POA: Diagnosis not present

## 2023-03-03 DIAGNOSIS — D509 Iron deficiency anemia, unspecified: Secondary | ICD-10-CM | POA: Diagnosis not present

## 2023-03-03 DIAGNOSIS — E559 Vitamin D deficiency, unspecified: Secondary | ICD-10-CM | POA: Diagnosis not present

## 2023-04-07 ENCOUNTER — Other Ambulatory Visit: Payer: Self-pay | Admitting: Family Medicine

## 2023-04-07 DIAGNOSIS — Z1231 Encounter for screening mammogram for malignant neoplasm of breast: Secondary | ICD-10-CM

## 2023-04-08 ENCOUNTER — Other Ambulatory Visit (INDEPENDENT_AMBULATORY_CARE_PROVIDER_SITE_OTHER): Payer: Medicare Other

## 2023-04-08 ENCOUNTER — Encounter: Payer: Self-pay | Admitting: Internal Medicine

## 2023-04-08 ENCOUNTER — Ambulatory Visit: Payer: Medicare Other | Admitting: Internal Medicine

## 2023-04-08 VITALS — BP 122/70 | HR 72 | Ht 64.0 in | Wt 161.5 lb

## 2023-04-08 DIAGNOSIS — Z86010 Personal history of colonic polyps: Secondary | ICD-10-CM

## 2023-04-08 DIAGNOSIS — Z8719 Personal history of other diseases of the digestive system: Secondary | ICD-10-CM | POA: Diagnosis not present

## 2023-04-08 DIAGNOSIS — E538 Deficiency of other specified B group vitamins: Secondary | ICD-10-CM

## 2023-04-08 DIAGNOSIS — D509 Iron deficiency anemia, unspecified: Secondary | ICD-10-CM

## 2023-04-08 LAB — CBC
HCT: 34.8 % — ABNORMAL LOW (ref 36.0–46.0)
Hemoglobin: 11.2 g/dL — ABNORMAL LOW (ref 12.0–15.0)
MCHC: 32.3 g/dL (ref 30.0–36.0)
MCV: 82 fl (ref 78.0–100.0)
Platelets: 394 10*3/uL (ref 150.0–400.0)
RBC: 4.24 Mil/uL (ref 3.87–5.11)
RDW: 14.1 % (ref 11.5–15.5)
WBC: 6.5 10*3/uL (ref 4.0–10.5)

## 2023-04-08 LAB — IBC + FERRITIN
Ferritin: 38.2 ng/mL (ref 10.0–291.0)
Iron: 68 ug/dL (ref 42–145)
Saturation Ratios: 14.3 % — ABNORMAL LOW (ref 20.0–50.0)
TIBC: 476 ug/dL — ABNORMAL HIGH (ref 250.0–450.0)
Transferrin: 340 mg/dL (ref 212.0–360.0)

## 2023-04-08 NOTE — Progress Notes (Signed)
Chief Complaint: IDA  HPI : 72 year old female with history of DM, obesity, and HTN presents for follow up of IDA  Interval History: She is not having any symptoms. She has been taking her iron supplements and vitamin B12 suppleemnts. She ran out of B12 supplements recently but plans to get some more soon. She completed her 8 weeks omeprazole BID and has not had her blood counts rechecked yet. Denies use of NSAIDs.  Current Outpatient Medications  Medication Sig Dispense Refill   amLODipine (NORVASC) 5 MG tablet Take 5 mg by mouth daily.     Calcium Carbonate-Vitamin D (CALCIUM + D PO) Take 600 mg by mouth 2 (two) times daily.     cyanocobalamin (VITAMIN B12) 1000 MCG tablet Take 1,000 mcg by mouth daily.     Ferrous Sulfate (IRON) 325 (65 Fe) MG TABS Take 1 tablet by mouth daily at 2 PM.     lisinopril-hydrochlorothiazide (ZESTORETIC) 20-25 MG tablet Take 1 tablet by mouth daily.     metFORMIN (GLUCOPHAGE) 1000 MG tablet Take 1,000 mg by mouth 2 (two) times daily with a meal.     omeprazole (PRILOSEC) 40 MG capsule Take 1 capsule (40 mg total) by mouth 2 (two) times daily before a meal. (Patient taking differently: Take 40 mg by mouth 2 (two) times daily before a meal. Take one pill twice daily 1/2 hour before meals for 8 weeks.) 56 capsule 0   rosuvastatin (CRESTOR) 5 MG tablet Take 5 mg by mouth daily.     No current facility-administered medications for this visit.    Physical Exam: BP 122/70 (BP Location: Left Arm, Patient Position: Sitting, Cuff Size: Normal)   Pulse 72   Ht 5\' 4"  (1.626 m)   Wt 161 lb 8 oz (73.3 kg)   BMI 27.72 kg/m  Constitutional: Pleasant,well-developed, female in no acute distress. HEENT: Normocephalic and atraumatic. Conjunctivae are normal. No scleral icterus. Cardiovascular: Normal rate, regular rhythm.  Pulmonary/chest: Effort normal and breath sounds normal. No wheezing, rales or rhonchi. Abdominal: Soft, nondistended, nontender. Bowel sounds active  throughout. There are no masses palpable. No hepatomegaly. Extremities: No edema Neurological: Alert and oriented to person place and time. Skin: Skin is warm and dry. No rashes noted. Psychiatric: Normal mood and affect. Behavior is normal.  Labs 09/2022: BMP with mildly elevated BUN of 27. CBC with low Hb of 10.9. Ferritin 27.1. Iron sat low at 14%. Folate nml. Vit B12 low at 165.   Colonoscopy 06/22/04: Normal colon.  Colonoscopy 06/21/14: Normal colon. Prep quality was excellent.  EGD 01/19/23:  Path: 1. Surgical [P], Small bowel - BENIGN SMALL BOWEL MUCOSA WITH FOVEOLAR METAPLASIA SUGGESTIVE OF PEPTIC INJURY 2. Surgical [P], gastric antrum and gastric body - MILD CHRONIC GASTRITIS WITH REACTIVE CHANGES. - NEGATIVE FOR H. PYLORI ON H&E STAIN - NEGATIVE FOR INTESTINAL METAPLASIA OR MALIGNANCY  Colonoscopy 01/19/23:  Path: 3. Surgical [P], colon, transverse, polyp (3) - TUBULAR ADENOMA(S). - NO HIGH GRADE DYSPLASIA OR MALIGNANCY. - SEPARATE POLYPOID FRAGMENT OF BENIGN COLONIC MUCOSA WITH MILD HYPERPLASTIC CHANGE  ASSESSMENT AND PLAN: IDA Vitamin B12 deficiency History of gastritis History of colon polyps Patient presents for follow up of IDA and vitamin B12 deficiency. She was recently noted to have gastritis with gastric erosions that may have been the source of her IDA. Biopsies were negative for H pylori. She completed 8 weeks of PPI BID so will see how her blood counts have responded.  - Check CBC, ferritin, vitamin B12. If  Hb is not improved, can consider VCE in the future.  - Continue to avoid NSAIDs - Next colonoscopy is due in 01/2026 for history of colon polyps  Eulah Pont, MD  I spent 36 minutes of time, including in depth chart review, independent review of results as outlined above, communicating results with the patient directly, face-to-face time with the patient, coordinating care, ordering studies and medications as appropriate, and documentation.

## 2023-04-08 NOTE — Patient Instructions (Addendum)
Your provider has requested that you go to the basement level for lab work before leaving today. Press "B" on the elevator. The lab is located at the first door on the left as you exit the elevator.   _______________________________________________________  If your blood pressure at your visit was 140/90 or greater, please contact your primary care physician to follow up on this.  _______________________________________________________  If you are age 72 or older, your body mass index should be between 23-30. Your Body mass index is 27.72 kg/m. If this is out of the aforementioned range listed, please consider follow up with your Primary Care Provider.  If you are age 10 or younger, your body mass index should be between 19-25. Your Body mass index is 27.72 kg/m. If this is out of the aformentioned range listed, please consider follow up with your Primary Care Provider.   ________________________________________________________  The [facility] providers would like to encourage you to use [facility] to communicate with providers for non-urgent requests or questions.  Due to long hold times on the telephone, sending your provider a message by [facility] may be a faster and more efficient way to get a response.  Please allow 48 business hours for a response.  Please remember that this is for non-urgent requests.  _______________________________________________________   Due to recent changes in healthcare laws, you may see the results of your imaging and laboratory studies on MyChart before your provider has had a chance to review them.  We understand that in some cases there may be results that are confusing or concerning to you. Not all laboratory results come back in the same time frame and the provider may be waiting for multiple results in order to interpret others.  Please give [place] 48 hours in order for your provider to thoroughly review all the results before contacting the office for clarification  of your results.    Thank you for entrusting me with your care and for choosing [facility], Dr. Eulah Pont

## 2023-04-09 LAB — VITAMIN B12: Vitamin B-12: 437 pg/mL (ref 200–1100)

## 2023-04-11 ENCOUNTER — Telehealth: Payer: Self-pay | Admitting: Internal Medicine

## 2023-04-11 NOTE — Telephone Encounter (Signed)
PT returning call to discuss results. Please advise

## 2023-04-11 NOTE — Telephone Encounter (Signed)
See alternate result note.  ?

## 2023-05-16 ENCOUNTER — Ambulatory Visit
Admission: RE | Admit: 2023-05-16 | Discharge: 2023-05-16 | Disposition: A | Discharge status: Home / Self Care | Payer: Medicare Other | Source: Ambulatory Visit | Attending: Family Medicine | Admitting: Family Medicine

## 2023-05-16 DIAGNOSIS — Z1231 Encounter for screening mammogram for malignant neoplasm of breast: Secondary | ICD-10-CM

## 2023-06-17 DIAGNOSIS — E119 Type 2 diabetes mellitus without complications: Secondary | ICD-10-CM | POA: Diagnosis not present

## 2023-08-11 DIAGNOSIS — Z0000 Encounter for general adult medical examination without abnormal findings: Secondary | ICD-10-CM | POA: Diagnosis not present

## 2023-09-02 DIAGNOSIS — D509 Iron deficiency anemia, unspecified: Secondary | ICD-10-CM | POA: Diagnosis not present

## 2023-09-02 DIAGNOSIS — E1122 Type 2 diabetes mellitus with diabetic chronic kidney disease: Secondary | ICD-10-CM | POA: Diagnosis not present

## 2023-09-15 DIAGNOSIS — E538 Deficiency of other specified B group vitamins: Secondary | ICD-10-CM | POA: Diagnosis not present

## 2023-09-15 DIAGNOSIS — D509 Iron deficiency anemia, unspecified: Secondary | ICD-10-CM | POA: Diagnosis not present

## 2023-09-15 DIAGNOSIS — E785 Hyperlipidemia, unspecified: Secondary | ICD-10-CM | POA: Diagnosis not present

## 2023-09-15 DIAGNOSIS — E1122 Type 2 diabetes mellitus with diabetic chronic kidney disease: Secondary | ICD-10-CM | POA: Diagnosis not present

## 2023-09-15 DIAGNOSIS — I10 Essential (primary) hypertension: Secondary | ICD-10-CM | POA: Diagnosis not present

## 2023-09-15 DIAGNOSIS — N1831 Chronic kidney disease, stage 3a: Secondary | ICD-10-CM | POA: Diagnosis not present

## 2024-03-14 DIAGNOSIS — B9789 Other viral agents as the cause of diseases classified elsewhere: Secondary | ICD-10-CM | POA: Diagnosis not present

## 2024-03-14 DIAGNOSIS — Z03818 Encounter for observation for suspected exposure to other biological agents ruled out: Secondary | ICD-10-CM | POA: Diagnosis not present

## 2024-03-14 DIAGNOSIS — J988 Other specified respiratory disorders: Secondary | ICD-10-CM | POA: Diagnosis not present

## 2024-03-19 DIAGNOSIS — E538 Deficiency of other specified B group vitamins: Secondary | ICD-10-CM | POA: Diagnosis not present

## 2024-03-19 DIAGNOSIS — E785 Hyperlipidemia, unspecified: Secondary | ICD-10-CM | POA: Diagnosis not present

## 2024-03-19 DIAGNOSIS — D509 Iron deficiency anemia, unspecified: Secondary | ICD-10-CM | POA: Diagnosis not present

## 2024-03-19 DIAGNOSIS — E1122 Type 2 diabetes mellitus with diabetic chronic kidney disease: Secondary | ICD-10-CM | POA: Diagnosis not present

## 2024-03-19 DIAGNOSIS — N1831 Chronic kidney disease, stage 3a: Secondary | ICD-10-CM | POA: Diagnosis not present

## 2024-03-23 DIAGNOSIS — R7989 Other specified abnormal findings of blood chemistry: Secondary | ICD-10-CM | POA: Diagnosis not present

## 2024-03-23 DIAGNOSIS — D649 Anemia, unspecified: Secondary | ICD-10-CM | POA: Diagnosis not present

## 2024-03-23 DIAGNOSIS — I10 Essential (primary) hypertension: Secondary | ICD-10-CM | POA: Diagnosis not present

## 2024-03-23 DIAGNOSIS — E1122 Type 2 diabetes mellitus with diabetic chronic kidney disease: Secondary | ICD-10-CM | POA: Diagnosis not present

## 2024-03-23 DIAGNOSIS — R808 Other proteinuria: Secondary | ICD-10-CM | POA: Diagnosis not present

## 2024-03-23 DIAGNOSIS — E538 Deficiency of other specified B group vitamins: Secondary | ICD-10-CM | POA: Diagnosis not present

## 2024-03-23 DIAGNOSIS — K2951 Unspecified chronic gastritis with bleeding: Secondary | ICD-10-CM | POA: Diagnosis not present

## 2024-04-16 ENCOUNTER — Other Ambulatory Visit: Payer: Self-pay | Admitting: Family Medicine

## 2024-04-16 DIAGNOSIS — Z1231 Encounter for screening mammogram for malignant neoplasm of breast: Secondary | ICD-10-CM

## 2024-05-12 DIAGNOSIS — E1122 Type 2 diabetes mellitus with diabetic chronic kidney disease: Secondary | ICD-10-CM | POA: Diagnosis not present

## 2024-05-12 DIAGNOSIS — N1831 Chronic kidney disease, stage 3a: Secondary | ICD-10-CM | POA: Diagnosis not present

## 2024-05-12 DIAGNOSIS — I10 Essential (primary) hypertension: Secondary | ICD-10-CM | POA: Diagnosis not present

## 2024-05-12 DIAGNOSIS — E785 Hyperlipidemia, unspecified: Secondary | ICD-10-CM | POA: Diagnosis not present

## 2024-05-16 ENCOUNTER — Ambulatory Visit
Admission: RE | Admit: 2024-05-16 | Discharge: 2024-05-16 | Disposition: A | Discharge status: Home / Self Care | Source: Ambulatory Visit | Attending: Family Medicine | Admitting: Family Medicine

## 2024-05-16 DIAGNOSIS — Z1231 Encounter for screening mammogram for malignant neoplasm of breast: Secondary | ICD-10-CM

## 2024-06-11 DIAGNOSIS — E1122 Type 2 diabetes mellitus with diabetic chronic kidney disease: Secondary | ICD-10-CM | POA: Diagnosis not present

## 2024-06-11 DIAGNOSIS — I10 Essential (primary) hypertension: Secondary | ICD-10-CM | POA: Diagnosis not present

## 2024-06-11 DIAGNOSIS — E785 Hyperlipidemia, unspecified: Secondary | ICD-10-CM | POA: Diagnosis not present

## 2024-06-11 DIAGNOSIS — N1831 Chronic kidney disease, stage 3a: Secondary | ICD-10-CM | POA: Diagnosis not present

## 2024-06-20 DIAGNOSIS — E119 Type 2 diabetes mellitus without complications: Secondary | ICD-10-CM | POA: Diagnosis not present

## 2024-07-12 DIAGNOSIS — E1122 Type 2 diabetes mellitus with diabetic chronic kidney disease: Secondary | ICD-10-CM | POA: Diagnosis not present

## 2024-07-12 DIAGNOSIS — I10 Essential (primary) hypertension: Secondary | ICD-10-CM | POA: Diagnosis not present

## 2024-07-12 DIAGNOSIS — N1831 Chronic kidney disease, stage 3a: Secondary | ICD-10-CM | POA: Diagnosis not present

## 2024-07-12 DIAGNOSIS — E785 Hyperlipidemia, unspecified: Secondary | ICD-10-CM | POA: Diagnosis not present

## 2024-08-12 DIAGNOSIS — I10 Essential (primary) hypertension: Secondary | ICD-10-CM | POA: Diagnosis not present

## 2024-08-12 DIAGNOSIS — E1122 Type 2 diabetes mellitus with diabetic chronic kidney disease: Secondary | ICD-10-CM | POA: Diagnosis not present

## 2024-08-12 DIAGNOSIS — E785 Hyperlipidemia, unspecified: Secondary | ICD-10-CM | POA: Diagnosis not present

## 2024-08-12 DIAGNOSIS — N1831 Chronic kidney disease, stage 3a: Secondary | ICD-10-CM | POA: Diagnosis not present

## 2024-08-20 DIAGNOSIS — Z0000 Encounter for general adult medical examination without abnormal findings: Secondary | ICD-10-CM | POA: Diagnosis not present

## 2024-09-11 DIAGNOSIS — I10 Essential (primary) hypertension: Secondary | ICD-10-CM | POA: Diagnosis not present

## 2024-09-11 DIAGNOSIS — E785 Hyperlipidemia, unspecified: Secondary | ICD-10-CM | POA: Diagnosis not present

## 2024-09-11 DIAGNOSIS — E1122 Type 2 diabetes mellitus with diabetic chronic kidney disease: Secondary | ICD-10-CM | POA: Diagnosis not present

## 2024-09-11 DIAGNOSIS — N1831 Chronic kidney disease, stage 3a: Secondary | ICD-10-CM | POA: Diagnosis not present

## 2024-09-12 DIAGNOSIS — E1122 Type 2 diabetes mellitus with diabetic chronic kidney disease: Secondary | ICD-10-CM | POA: Diagnosis not present

## 2024-09-12 DIAGNOSIS — E785 Hyperlipidemia, unspecified: Secondary | ICD-10-CM | POA: Diagnosis not present

## 2024-09-12 DIAGNOSIS — D649 Anemia, unspecified: Secondary | ICD-10-CM | POA: Diagnosis not present

## 2024-09-17 DIAGNOSIS — D509 Iron deficiency anemia, unspecified: Secondary | ICD-10-CM | POA: Diagnosis not present

## 2024-09-17 DIAGNOSIS — E785 Hyperlipidemia, unspecified: Secondary | ICD-10-CM | POA: Diagnosis not present

## 2024-09-17 DIAGNOSIS — I10 Essential (primary) hypertension: Secondary | ICD-10-CM | POA: Diagnosis not present

## 2024-09-17 DIAGNOSIS — Z0000 Encounter for general adult medical examination without abnormal findings: Secondary | ICD-10-CM | POA: Diagnosis not present

## 2024-09-17 DIAGNOSIS — D649 Anemia, unspecified: Secondary | ICD-10-CM | POA: Diagnosis not present

## 2024-09-17 DIAGNOSIS — E1122 Type 2 diabetes mellitus with diabetic chronic kidney disease: Secondary | ICD-10-CM | POA: Diagnosis not present

## 2024-09-17 DIAGNOSIS — E538 Deficiency of other specified B group vitamins: Secondary | ICD-10-CM | POA: Diagnosis not present

## 2024-09-17 DIAGNOSIS — N1831 Chronic kidney disease, stage 3a: Secondary | ICD-10-CM | POA: Diagnosis not present

## 2024-09-18 ENCOUNTER — Other Ambulatory Visit (HOSPITAL_BASED_OUTPATIENT_CLINIC_OR_DEPARTMENT_OTHER): Payer: Self-pay | Admitting: Family Medicine

## 2024-09-18 DIAGNOSIS — E2839 Other primary ovarian failure: Secondary | ICD-10-CM
